# Patient Record
Sex: Female | Born: 1970
Health system: Southern US, Community
[De-identification: ages and names within clinical notes are randomized; demographics above are authoritative.]

## PROBLEM LIST (undated history)

## (undated) DIAGNOSIS — Z9889 Other specified postprocedural states: Secondary | ICD-10-CM

## (undated) DIAGNOSIS — R112 Nausea with vomiting, unspecified: Secondary | ICD-10-CM

## (undated) HISTORY — PX: OOPHORECTOMY: SHX86

## (undated) HISTORY — PX: ABDOMINAL HYSTERECTOMY: SHX81

---

## 1997-05-30 ENCOUNTER — Other Ambulatory Visit: Admission: RE | Admit: 1997-05-30 | Discharge: 1997-05-30 | Payer: Self-pay | Admitting: Obstetrics and Gynecology

## 1998-07-04 ENCOUNTER — Other Ambulatory Visit: Admission: RE | Admit: 1998-07-04 | Discharge: 1998-07-04 | Payer: Self-pay | Admitting: Obstetrics and Gynecology

## 1999-08-03 ENCOUNTER — Other Ambulatory Visit: Admission: RE | Admit: 1999-08-03 | Discharge: 1999-08-03 | Payer: Self-pay | Admitting: Obstetrics and Gynecology

## 1999-09-18 ENCOUNTER — Ambulatory Visit (HOSPITAL_COMMUNITY): Admission: RE | Admit: 1999-09-18 | Discharge: 1999-09-18 | Payer: Self-pay | Admitting: Obstetrics and Gynecology

## 2000-08-01 ENCOUNTER — Other Ambulatory Visit: Admission: RE | Admit: 2000-08-01 | Discharge: 2000-08-01 | Payer: Self-pay | Admitting: Obstetrics and Gynecology

## 2001-06-16 ENCOUNTER — Other Ambulatory Visit: Admission: RE | Admit: 2001-06-16 | Discharge: 2001-06-16 | Payer: Self-pay | Admitting: Obstetrics and Gynecology

## 2001-10-12 ENCOUNTER — Ambulatory Visit (HOSPITAL_COMMUNITY): Admission: RE | Admit: 2001-10-12 | Discharge: 2001-10-12 | Payer: Self-pay | Admitting: Obstetrics and Gynecology

## 2001-11-03 ENCOUNTER — Inpatient Hospital Stay (HOSPITAL_COMMUNITY): Admission: AD | Admit: 2001-11-03 | Discharge: 2001-11-06 | Payer: Self-pay | Admitting: *Deleted

## 2001-11-07 ENCOUNTER — Encounter: Admission: RE | Admit: 2001-11-07 | Discharge: 2001-12-07 | Payer: Self-pay | Admitting: Obstetrics and Gynecology

## 2001-12-06 ENCOUNTER — Other Ambulatory Visit: Admission: RE | Admit: 2001-12-06 | Discharge: 2001-12-06 | Payer: Self-pay | Admitting: Obstetrics and Gynecology

## 2002-12-26 ENCOUNTER — Other Ambulatory Visit: Admission: RE | Admit: 2002-12-26 | Discharge: 2002-12-26 | Payer: Self-pay | Admitting: Obstetrics and Gynecology

## 2004-01-21 ENCOUNTER — Other Ambulatory Visit: Admission: RE | Admit: 2004-01-21 | Discharge: 2004-01-21 | Payer: Self-pay | Admitting: Obstetrics and Gynecology

## 2004-02-28 ENCOUNTER — Observation Stay (HOSPITAL_COMMUNITY): Admission: RE | Admit: 2004-02-28 | Discharge: 2004-02-29 | Payer: Self-pay | Admitting: Obstetrics and Gynecology

## 2004-05-23 ENCOUNTER — Ambulatory Visit (HOSPITAL_COMMUNITY): Admission: RE | Admit: 2004-05-23 | Discharge: 2004-05-23 | Payer: Self-pay | Admitting: Obstetrics and Gynecology

## 2005-02-08 ENCOUNTER — Other Ambulatory Visit: Admission: RE | Admit: 2005-02-08 | Discharge: 2005-02-08 | Payer: Self-pay | Admitting: Obstetrics and Gynecology

## 2005-09-15 ENCOUNTER — Emergency Department (HOSPITAL_COMMUNITY): Admission: EM | Admit: 2005-09-15 | Discharge: 2005-09-16 | Payer: Self-pay | Admitting: Emergency Medicine

## 2005-09-16 ENCOUNTER — Observation Stay (HOSPITAL_COMMUNITY): Admission: RE | Admit: 2005-09-16 | Discharge: 2005-09-17 | Payer: Self-pay | Admitting: Family Medicine

## 2014-05-27 ENCOUNTER — Encounter: Payer: Self-pay | Admitting: Obstetrics and Gynecology

## 2014-05-27 ENCOUNTER — Ambulatory Visit (INDEPENDENT_AMBULATORY_CARE_PROVIDER_SITE_OTHER): Payer: BLUE CROSS/BLUE SHIELD | Admitting: Obstetrics and Gynecology

## 2014-05-27 VITALS — BP 110/66 | Ht 65.0 in | Wt 151.0 lb

## 2014-05-27 DIAGNOSIS — Z01419 Encounter for gynecological examination (general) (routine) without abnormal findings: Secondary | ICD-10-CM | POA: Diagnosis not present

## 2014-05-27 DIAGNOSIS — A630 Anogenital (venereal) warts: Secondary | ICD-10-CM

## 2014-05-27 DIAGNOSIS — Z0000 Encounter for general adult medical examination without abnormal findings: Secondary | ICD-10-CM

## 2014-05-27 NOTE — Progress Notes (Signed)
Patient ID: Natasha Anderson, female   DOB: March 09, 1970, 44 y.o.   MRN: [id number]  Assessment:  Annual Gyn Exam Condyloma of rt labia majora, for future excision. Plan:  1. pap smear done, next pap due 1 year 2. return annually or prn 3.   Annual mammogram advised, annually or biannually 4.   Discussed removal of condyloma in 1 month, recommend excision over TCA  Subjective:  Natasha Anderson is a 44 y.o. female, with a history of dysplasia, s/p abdominal hysterectomy and right oophorectomy. No obstetric history on file. who presents for annual exam. No LMP recorded. Patient has had a hysterectomy. The patient has no complaints today. She reports intermittent hot flashes.    The following portions of the patient's history were reviewed and updated as appropriate: allergies, current medications, past family history, past medical history, past social history, past surgical history and problem list. History reviewed. No pertinent past medical history.  Past Surgical History  Procedure Laterality Date  . Abdominal hysterectomy    . Oophorectomy       Current outpatient prescriptions:  .  hydrOXYzine (ATARAX/VISTARIL) 25 MG tablet, Take 25 mg by mouth at bedtime. Pt states that she only takes 1/2 a pill., Disp: , Rfl:  .  Multiple Vitamin (MULTIVITAMIN) tablet, Take 1 tablet by mouth daily., Disp: , Rfl:   Review of Systems Constitutional: negative Gastrointestinal: negative Genitourinary: negative  Objective:  BP 110/66 mmHg  Ht 5\' 5"  (1.651 m)  Wt 151 lb (68.493 kg)  BMI 25.13 kg/m2   BMI: Body mass index is 25.13 kg/(m^2).  General Appearance: Alert, appropriate appearance for age. No acute distress HEENT: Grossly normal Neck / Thyroid:  Cardiovascular: RRR; normal S1, S2, no murmur Lungs: CTA bilaterally Back: No CVAT Breast Exam: No masses or nodes.No dimpling, nipple retraction or discharge. Gastrointestinal: Soft, non-tender, no masses or organomegaly Pelvic Exam: Vulva  and vagina appear normal.  External genitalia: normal general appearance; 1 cm flat condyloma inner aspects of right labia majora Vaginal: normal mucosa without prolapse or lesions; well-estrogenized tissue Cervix: surgically absent Adnexa: surgically absent Uterus: surgically absent Rectovaginal: well-supported; guaiac negative stool obtained Lymphatic Exam: Non-palpable nodes in neck, clavicular, axillary, or inguinal regions  Skin: no rash or abnormalities Neurologic: Normal gait and speech, no tremor  Psychiatric: Alert and oriented, appropriate affect.  Urinalysis:Not done Hemoccult: Negative  Christin BachJohn Royetta Probus. MD Pgr [phone] 2:33 PM   This chart was scribed for Tilda BurrowJohn Owyn Raulston V, MD by Gwenyth Oberatherine Macek, ED Scribe. This patient was seen in room 1 and the patient's care was started at 2:33 PM.   I personally performed the services described in this documentation, which was SCRIBED in my presence. The recorded information has been reviewed and considered accurate. It has been edited as necessary during review. Tilda BurrowFERGUSON,Kriti Katayama V, MD

## 2014-05-27 NOTE — Progress Notes (Signed)
Patient ID: Natasha Anderson, female   DOB: [birth year], 44 y.o.   MRN: [id number] Pt here today for annual exam. Pt states that she was unsure when she should start getting mammograms. Pt denies any problems or concerns at this time.

## 2014-06-06 ENCOUNTER — Other Ambulatory Visit: Payer: Self-pay | Admitting: Obstetrics and Gynecology

## 2014-06-06 DIAGNOSIS — Z1231 Encounter for screening mammogram for malignant neoplasm of breast: Secondary | ICD-10-CM

## 2014-06-28 ENCOUNTER — Ambulatory Visit: Payer: BLUE CROSS/BLUE SHIELD | Admitting: Obstetrics and Gynecology

## 2014-07-11 ENCOUNTER — Ambulatory Visit (HOSPITAL_COMMUNITY)
Admission: RE | Admit: 2014-07-11 | Discharge: 2014-07-11 | Disposition: A | Discharge status: Home / Self Care | Payer: BLUE CROSS/BLUE SHIELD | Source: Ambulatory Visit | Attending: Obstetrics and Gynecology | Admitting: Obstetrics and Gynecology

## 2014-07-11 DIAGNOSIS — Z1231 Encounter for screening mammogram for malignant neoplasm of breast: Secondary | ICD-10-CM

## 2016-03-19 ENCOUNTER — Other Ambulatory Visit: Payer: Self-pay | Admitting: Obstetrics and Gynecology

## 2016-03-19 DIAGNOSIS — Z1231 Encounter for screening mammogram for malignant neoplasm of breast: Secondary | ICD-10-CM

## 2016-04-02 ENCOUNTER — Ambulatory Visit (INDEPENDENT_AMBULATORY_CARE_PROVIDER_SITE_OTHER): Payer: BLUE CROSS/BLUE SHIELD | Admitting: Obstetrics and Gynecology

## 2016-04-02 ENCOUNTER — Encounter: Payer: Self-pay | Admitting: Obstetrics and Gynecology

## 2016-04-02 DIAGNOSIS — A630 Anogenital (venereal) warts: Secondary | ICD-10-CM | POA: Insufficient documentation

## 2016-04-02 DIAGNOSIS — Z01419 Encounter for gynecological examination (general) (routine) without abnormal findings: Secondary | ICD-10-CM | POA: Insufficient documentation

## 2016-04-02 NOTE — Progress Notes (Signed)
  Assessment:  Annual Gyn Exam   Plan:  1. pap smear done, next pap due 3y 2. return annually or prn 3    Annual mammogram advised Subjective:  Natasha Anderson is a 46 y.o. female, who presents for annual exam. No LMP recorded. Patient has had a hysterectomy; no longer has menstrual periods. The patient has no acute complaints today but mentions continued hot flashes. She uses OTC estrogen meds to help manage her hot flashes. Her last pap smear was in 2016.  She also notes she has a mammogram scheduled for April 1st, 2018.   The following portions of the patient's history were reviewed and updated as appropriate: allergies, current medications, past family history, past medical history, past social history, past surgical history and problem list.  History reviewed. No pertinent past medical history.  Past Surgical History:  Procedure Laterality Date  . ABDOMINAL HYSTERECTOMY    . OOPHORECTOMY       Current Outpatient Prescriptions:  .  Black Cohosh-SoyIsoflav-Magnol (ESTROVEN MENOPAUSE RELIEF PO), Take by mouth., Disp: , Rfl:  .  hydrOXYzine (ATARAX/VISTARIL) 25 MG tablet, Take 25 mg by mouth at bedtime. Pt states that she only takes 1/2 a pill., Disp: , Rfl:  .  Multiple Vitamin (MULTIVITAMIN) tablet, Take 1 tablet by mouth daily., Disp: , Rfl:   Review of Systems Otherwise negative for acute change except as noted in the HPI.  Objective:  BP 114/80   Pulse 72   Ht 5\' 4"  (1.626 m)   Wt 143 lb (64.9 kg)   BMI 24.55 kg/m    BMI: Body mass index is 24.55 kg/m.  General Appearance: Alert, appropriate appearance for age. No acute distress HEENT: Grossly normal Neck / Thyroid:  Cardiovascular: RRR; normal S1, S2, no murmur Lungs: CTA bilaterally Back: No CVAT Breast Exam: No dimpling, nipple retraction or discharge. No masses or nodes. and No masses or nodes.No dimpling, nipple retraction or discharge. Gastrointestinal: Soft, non-tender, no masses or organomegaly Pelvic  Exam:  External genitalia: vulvar condyloma right labia, stable  Vaginal:  top of vaginal is well supported Cervix: cuff well healed  Adnexa: good support, mild thickening on the right  Uterus: absent  Pap smear done today Rectovaginal: normal rectal, no masses Lymphatic Exam: Non-palpable nodes in neck, clavicular, axillary, or inguinal regions Skin: no rash or abnormalities Neurologic: Normal gait and speech, no tremor  Psychiatric: Alert and oriented, appropriate affect.  Urinalysis:Not done   By signing my name below, I, Freida Busmaniana Omoyeni, attest that this documentation has been prepared under the direction and in the presence of Tilda BurrowJohn V Anelia Carriveau, MD . Electronically Signed: Freida Busmaniana Omoyeni, Scribe. 04/02/2016. 10:14 AM.  I personally performed the services described in this documentation, which was SCRIBED in my presence. The recorded information has been reviewed and considered accurate. It has been edited as necessary during review. Tilda BurrowFERGUSON,Micah Barnier V, MD

## 2016-04-06 ENCOUNTER — Other Ambulatory Visit: Payer: Self-pay | Admitting: Obstetrics and Gynecology

## 2016-04-06 ENCOUNTER — Telehealth: Payer: Self-pay | Admitting: Obstetrics and Gynecology

## 2016-04-06 DIAGNOSIS — N951 Menopausal and female climacteric states: Secondary | ICD-10-CM | POA: Insufficient documentation

## 2016-04-06 MED ORDER — ESTRADIOL 0.52 MG/0.87 GM (0.06%) TD GEL: 1.0000 "application " | Freq: Every evening | TRANSDERMAL | 99 refills | Status: DC

## 2016-04-06 NOTE — Telephone Encounter (Signed)
elestrin topical estrogen ordered.

## 2016-04-06 NOTE — Progress Notes (Signed)
Elestrin  Ordered.

## 2016-04-06 NOTE — Telephone Encounter (Signed)
Pt called stating that she seen Dr. Emelda FearFerguson on the 16th and he was suppose to call her in a medication for her hot flashes, pt states that he never did. Pt uses [facility]. let pt know that Dr. Althea GrimmerFerguosn is not here today. Please contact pt

## 2016-04-22 ENCOUNTER — Other Ambulatory Visit: Payer: Self-pay | Admitting: Obstetrics and Gynecology

## 2016-04-22 ENCOUNTER — Ambulatory Visit (HOSPITAL_COMMUNITY)
Admission: RE | Admit: 2016-04-22 | Discharge: 2016-04-22 | Disposition: A | Discharge status: Home / Self Care | Payer: BLUE CROSS/BLUE SHIELD | Source: Ambulatory Visit | Attending: Obstetrics and Gynecology | Admitting: Obstetrics and Gynecology

## 2016-04-22 DIAGNOSIS — R928 Other abnormal and inconclusive findings on diagnostic imaging of breast: Secondary | ICD-10-CM | POA: Diagnosis not present

## 2016-04-22 DIAGNOSIS — Z1231 Encounter for screening mammogram for malignant neoplasm of breast: Secondary | ICD-10-CM | POA: Diagnosis not present

## 2016-05-18 ENCOUNTER — Ambulatory Visit (HOSPITAL_COMMUNITY)
Admission: RE | Admit: 2016-05-18 | Discharge: 2016-05-18 | Disposition: A | Discharge status: Home / Self Care | Payer: BLUE CROSS/BLUE SHIELD | Source: Ambulatory Visit | Attending: Obstetrics and Gynecology | Admitting: Obstetrics and Gynecology

## 2016-05-18 DIAGNOSIS — R928 Other abnormal and inconclusive findings on diagnostic imaging of breast: Secondary | ICD-10-CM | POA: Insufficient documentation

## 2016-05-18 DIAGNOSIS — R922 Inconclusive mammogram: Secondary | ICD-10-CM | POA: Diagnosis not present

## 2016-11-09 DIAGNOSIS — K080 Exfoliation of teeth due to systemic causes: Secondary | ICD-10-CM | POA: Diagnosis not present

## 2017-04-26 ENCOUNTER — Other Ambulatory Visit: Payer: Self-pay | Admitting: Obstetrics and Gynecology

## 2017-06-02 ENCOUNTER — Other Ambulatory Visit: Payer: Self-pay | Admitting: Obstetrics and Gynecology

## 2017-08-06 ENCOUNTER — Other Ambulatory Visit: Payer: Self-pay | Admitting: Obstetrics and Gynecology

## 2017-08-06 NOTE — Telephone Encounter (Signed)
estrogel 50 gm tube with 3 refil E-scribed

## 2017-08-11 ENCOUNTER — Ambulatory Visit (INDEPENDENT_AMBULATORY_CARE_PROVIDER_SITE_OTHER): Payer: BLUE CROSS/BLUE SHIELD | Admitting: Obstetrics and Gynecology

## 2017-08-11 ENCOUNTER — Encounter: Payer: Self-pay | Admitting: Obstetrics and Gynecology

## 2017-08-11 VITALS — BP 100/55 | HR 92 | Ht 66.0 in | Wt 144.0 lb

## 2017-08-11 DIAGNOSIS — Z01419 Encounter for gynecological examination (general) (routine) without abnormal findings: Secondary | ICD-10-CM | POA: Diagnosis not present

## 2017-08-11 MED ORDER — ESTRADIOL 0.75 MG/1.25 GM (0.06%) TD GEL: TRANSDERMAL | 3 refills | Status: DC

## 2017-08-11 NOTE — Progress Notes (Signed)
Patient ID: Natasha Anderson, female   DOB: [birth year], 47 y.o.   MRN: [id number]   Assessment:  Annual Gyn Exam Plan:  1. next pap due 3 yrs 2. return annually or prn 3    Annual mammogram advised after age 48 Subjective:  Natasha Anderson is a 47 y.o. female G3P3 who presents for annual exam. No LMP recorded. Patient has had a hysterectomy. The patient has no complaints today but is still struggling with hot flashes sometimes. Transdermal estrogen works for this. She has a large scratch on her leg from being scratched by a branch while walking her dog. She used to run 2-3 miles per day but suffered from a leg injury so she walks now. 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 She reports no issues with her bowel movements. She has a history of endometriosis. She was last here for her annual exam on 04/02/2016 and her last mammogram was on 04/18/2016 and she is planning on scheduling another one. She does not currently have a PCP.  The following portions of the patient's history were reviewed and updated as appropriate: allergies, current medications, past family history, past medical history, past social history, past surgical history and problem list. History reviewed. No pertinent past medical history.  Past Surgical History:  Procedure Laterality Date  . ABDOMINAL HYSTERECTOMY    . OOPHORECTOMY       Current Outpatient Medications:  .  Black Cohosh-SoyIsoflav-Magnol (ESTROVEN MENOPAUSE RELIEF PO), Take by mouth., Disp: , Rfl:  .  ESTROGEL 0.75 MG/1.25 GM (0.06%) topical gel, APPLY TO THE AFFECTED AREA ONCE NIGHTLY, Disp: 50 g, Rfl: 3 .  Multiple Vitamin (MULTIVITAMIN) tablet, Take 1 tablet by mouth daily., Disp: , Rfl:  .  OVER THE COUNTER MEDICATION, , Disp: , Rfl:  .  Estradiol 0.52 MG/0.87 GM (0.06%) GEL, Apply 1 application topically Nightly. (Patient not taking: Reported on 08/11/2017), Disp: 1 Bottle, Rfl: prn .  hydrOXYzine (ATARAX/VISTARIL) 25 MG tablet, Take 25 mg by mouth at bedtime.  Pt states that she only takes 1/2 a pill., Disp: , Rfl:   Review of Systems Constitutional: negative Gastrointestinal: negative Genitourinary: negative  Objective:  BP (!) 100/55 (BP Location: Right Arm, Patient Position: Sitting, Cuff Size: Small)   Pulse 92   Ht 5\' 6"  (1.676 m)   Wt 144 lb (65.3 kg)   BMI 23.24 kg/m    BMI: Body mass index is 23.24 kg/m.  General Appearance: Alert, appropriate appearance for age. No acute distress HEENT: Grossly normal Neck / Thyroid:  Cardiovascular: RRR; normal S1, S2, no murmur Lungs: CTA bilaterally Back: No CVAT Breast Exam: No masses or nodes.No dimpling, nipple retraction or discharge. Gastrointestinal: Soft, non-tender, no masses or organomegaly Pelvic Exam: External genitalia: normal general appearance Vaginal: well supported Cervix: normal Adnexa: normal Uterus: surgically removed Rectovaginal: not indicated and guaiac negative stool obtained Lymphatic Exam: Non-palpable nodes in neck, clavicular, axillary, or inguinal regions Skin: no rash or abnormalities Neurologic: Normal gait and speech, no tremor  Psychiatric: Alert and oriented, appropriate affect.  Urinalysis:normal and Not done  Christin BachJohn Shamieka Gullo. MD Pgr [phone] 9:34 AM   By signing my name below, I, Pietro CassisEmily Tufford, attest that this documentation has been prepared under the direction and in the presence of Tilda BurrowFerguson, Kindell Strada V, MD. Electronically Signed: Pietro CassisEmily Tufford, Medical Scribe. 08/11/17. 9:34 AM.  I personally performed the services described in this documentation, which was SCRIBED in my presence. The recorded information has been reviewed and considered accurate. It  has been edited as necessary during review. Jonnie Kind, MD

## 2017-08-11 NOTE — Patient Instructions (Signed)
[facility] [phone]

## 2017-08-19 DIAGNOSIS — Z01419 Encounter for gynecological examination (general) (routine) without abnormal findings: Secondary | ICD-10-CM | POA: Diagnosis not present

## 2017-08-20 LAB — CBC
Hematocrit: 47.3 % — ABNORMAL HIGH (ref 34.0–46.6)
Hemoglobin: 15.4 g/dL (ref 11.1–15.9)
MCH: 29.9 pg (ref 26.6–33.0)
MCHC: 32.6 g/dL (ref 31.5–35.7)
MCV: 92 fL (ref 79–97)
Platelets: 249 10*3/uL (ref 150–450)
RBC: 5.15 x10E6/uL (ref 3.77–5.28)
RDW: 12.1 % — ABNORMAL LOW (ref 12.3–15.4)
WBC: 6.5 10*3/uL (ref 3.4–10.8)

## 2017-08-20 LAB — COMPREHENSIVE METABOLIC PANEL
ALT: 15 IU/L (ref 0–32)
AST: 23 IU/L (ref 0–40)
Albumin/Globulin Ratio: 2.4 — ABNORMAL HIGH (ref 1.2–2.2)
Albumin: 4.1 g/dL (ref 3.5–5.5)
Alkaline Phosphatase: 30 IU/L — ABNORMAL LOW (ref 39–117)
BUN/Creatinine Ratio: 11 (ref 9–23)
BUN: 7 mg/dL (ref 6–24)
Bilirubin Total: 0.3 mg/dL (ref 0.0–1.2)
CO2: 25 mmol/L (ref 20–29)
Calcium: 9 mg/dL (ref 8.7–10.2)
Chloride: 104 mmol/L (ref 96–106)
Creatinine, Ser: 0.64 mg/dL (ref 0.57–1.00)
GFR calc Af Amer: 123 mL/min/{1.73_m2} (ref >59)
GFR calc non Af Amer: 107 mL/min/{1.73_m2} (ref >59)
Globulin, Total: 1.7 g/dL (ref 1.5–4.5)
Glucose: 97 mg/dL (ref 65–99)
Potassium: 4.5 mmol/L (ref 3.5–5.2)
Sodium: 144 mmol/L (ref 134–144)
Total Protein: 5.8 g/dL — ABNORMAL LOW (ref 6.0–8.5)

## 2017-08-20 LAB — TSH: TSH: 0.667 u[IU]/mL (ref 0.450–4.500)

## 2017-08-20 LAB — LIPID PANEL
Chol/HDL Ratio: 2.6 ratio (ref 0.0–4.4)
Cholesterol, Total: 189 mg/dL (ref 100–199)
HDL: 74 mg/dL (ref >39)
LDL Calculated: 103 mg/dL — ABNORMAL HIGH (ref 0–99)
Triglycerides: 59 mg/dL (ref 0–149)
VLDL Cholesterol Cal: 12 mg/dL (ref 5–40)

## 2018-02-22 ENCOUNTER — Other Ambulatory Visit: Payer: Self-pay | Admitting: Obstetrics and Gynecology

## 2018-07-21 ENCOUNTER — Other Ambulatory Visit: Payer: Self-pay | Admitting: Obstetrics and Gynecology

## 2018-07-21 MED ORDER — ESTROGEL 0.75 MG/1.25 GM (0.06%) TD GEL: TRANSDERMAL | 3 refills | Status: DC

## 2018-07-21 NOTE — Progress Notes (Signed)
estrogel reordered

## 2018-07-27 ENCOUNTER — Telehealth: Payer: Self-pay | Admitting: *Deleted

## 2018-07-27 NOTE — Telephone Encounter (Signed)
Patient called stating the pharmacy said they cannot get a response back from our office. Please advise

## 2018-08-11 ENCOUNTER — Ambulatory Visit (INDEPENDENT_AMBULATORY_CARE_PROVIDER_SITE_OTHER): Payer: BC Managed Care – PPO | Admitting: Orthopedic Surgery

## 2018-08-11 ENCOUNTER — Encounter: Payer: Self-pay | Admitting: Orthopedic Surgery

## 2018-08-11 ENCOUNTER — Other Ambulatory Visit: Payer: Self-pay

## 2018-08-11 ENCOUNTER — Ambulatory Visit: Payer: BC Managed Care – PPO

## 2018-08-11 VITALS — BP 114/62 | HR 68 | Temp 97.5°F | Ht 66.0 in | Wt 145.0 lb

## 2018-08-11 DIAGNOSIS — S83241A Other tear of medial meniscus, current injury, right knee, initial encounter: Secondary | ICD-10-CM | POA: Diagnosis not present

## 2018-08-11 DIAGNOSIS — M25562 Pain in left knee: Secondary | ICD-10-CM

## 2018-08-11 NOTE — Patient Instructions (Signed)
Acute Knee Pain, Adult °Acute knee pain is sudden and may be caused by damage, swelling, or irritation of the muscles and tissues that support your knee. The injury may result from: °· A fall. °· An injury to your knee from twisting motions. °· A hit to the knee. °· Infection. °Acute knee pain may go away on its own with time and rest. If it does not, your health care provider may order tests to find the cause of the pain. These may include: °· Imaging tests, such as an X-ray, MRI, or ultrasound. °· Joint aspiration. In this test, fluid is removed from the knee. °· Arthroscopy. In this test, a lighted tube is inserted into the knee and an image is projected onto a TV screen. °· Biopsy. In this test, a sample of tissue is removed from the body and studied under a microscope. °Follow these instructions at home: °Pay attention to any changes in your symptoms. Take these actions to relieve your pain. °If you have a knee sleeve or brace: ° °· Wear the sleeve or brace as told by your health care provider. Remove it only as told by your health care provider. °· Loosen the sleeve or brace if your toes tingle, become numb, or turn cold and blue. °· Keep the sleeve or brace clean. °· If the sleeve or brace is not waterproof: °? Do not let it get wet. °? Cover it with a watertight covering when you take a bath or shower. °Activity °· Rest your knee. °· Do not do things that cause pain or make pain worse. °· Avoid high-impact activities or exercises, such as running, jumping rope, or doing jumping jacks. °· Work with a physical therapist to make a safe exercise program, as recommended by your health care provider. Do exercises as told by your physical therapist. °Managing pain, stiffness, and swelling ° °· If directed, put ice on the knee: °? Put ice in a plastic bag. °? Place a towel between your skin and the bag. °? Leave the ice on for 20 minutes, 2-3 times a day. °· If directed, use an elastic bandage to put pressure  (compression) on your injured knee. This may control swelling, give support, and help with discomfort. °General instructions °· Take over-the-counter and prescription medicines only as told by your health care provider. °· Raise (elevate) your knee above the level of your heart when you are sitting or lying down. °· Sleep with a pillow under your knee. °· Do not use any products that contain nicotine or tobacco, such as cigarettes, e-cigarettes, and chewing tobacco. These can delay healing. If you need help quitting, ask your health care provider. °· If you are overweight, work with your health care provider and a dietitian to set a weight-loss goal that is healthy and reasonable for you. Extra weight can put pressure on your knee. °· Keep all follow-up visits as told by your health care provider. This is important. °Contact a health care provider if: °· Your knee pain continues, changes, or gets worse. °· You have a fever along with knee pain. °· Your knee feels warm to the touch. °· Your knee buckles or locks up. °Get help right away if: °· Your knee swells, and the swelling becomes worse. °· You cannot move your knee. °· You have severe pain in your knee. °Summary °· Acute knee pain can be caused by a fall, an injury, an infection, or damage, swelling, or irritation of the tissues that support your knee. °·   Your health care provider may perform tests to find out the cause of the pain. °· Pay attention to any changes in your symptoms. Relieve your pain with rest, medicines, light activity, and use of ice. °· Get help if your pain continues or becomes worse, your knee swells, or you cannot move your knee. °This information is not intended to replace advice given to you by your health care provider. Make sure you discuss any questions you have with your health care provider. °Document Released: 11/01/2006 Document Revised: 06/16/2017 Document Reviewed: 06/16/2017 °Elsevier Patient Education © 2020 [employer] ° °

## 2018-08-11 NOTE — Addendum Note (Signed)
Addended byCandice Camp on: 08/11/2018 08:59 AM   Modules accepted: Orders

## 2018-08-11 NOTE — Progress Notes (Signed)
Natasha Anderson  08/11/2018  HISTORY SECTION :  Chief Complaint  Patient presents with  . Knee Pain    right knee pain x 3-4 weeks    The patient presents for evaluation of right knee medial pain x1 month.  She is a 48 year old female runs 2 miles a day when healthy presents with 1 month history of medial knee pain which may have been brought on by new exercise program.  Denies giving way but reports swelling every day unrelieved by ibuprofen.  Pain is not improving with ibuprofen ice rest change in activity and knee sleeve     Review of Systems  Neurological: Negative for tingling.  All other systems reviewed and are negative.    History reviewed. No pertinent past medical history.  No diabetes heart disease or hypertension  Past Surgical History:  Procedure Laterality Date  . ABDOMINAL HYSTERECTOMY    . OOPHORECTOMY       No Known Allergies   Current Outpatient Medications:  .  Estradiol (ESTROGEL) 0.75 MG/1.25 GM (0.06%) topical gel, APPLY TO THE AFFECTED AREA ONCE NIGHTLY, Disp: 50 g, Rfl: 3 .  Multiple Vitamin (MULTIVITAMIN) tablet, Take 1 tablet by mouth daily., Disp: , Rfl:    PHYSICAL EXAM SECTION: 1) BP 114/62   Pulse 68   Temp (!) 97.5 F (36.4 C)   Ht 5\' 6"  (1.676 m)   Wt 145 lb (65.8 kg)   BMI 23.40 kg/m   Body mass index is 23.4 kg/m. General appearance: Well-developed well-nourished no gross deformities  2) Cardiovascular normal pulse and perfusion in all 4 extremities normal color without edema  3) Neurologically deep tendon reflexes are equal and normal, no sensation loss or deficits no pathologic reflexes  4) Psychological: Awake alert and oriented x3 mood and affect normal  5) Skin no lacerations or ulcerations no nodularity no palpable masses, no erythema or nodularity  6) Musculoskeletal:  Left knee no tenderness normal range of motion all ligaments stable muscle tone normal skin intact with no rashes lesions or ulcerations no  erythema  Right knee alignment is normal she has tenderness on the medial joint line she has a small knee effusion she has full range of motion with pain at terminal flexion.  No ligamentous instability normal strength and muscle tone normal patellar mobility without apprehension  Positive McMurray sign for medial pain   MEDICAL DECISION SECTION:  Encounter Diagnoses  Name Primary?  . Acute pain of left knee   . Acute tear medial meniscus, right, initial encounter Yes    Imaging Joint effusion slight valgus, see report  Plan:  (Rx., Inj., surg., Frx, MRI/CT, XR:2) Home exercises quad strengthening Ibuprofen Ice Activity modification  MRI medial meniscal tear rule out 8:57 AM Arther Abbott, MD  08/11/2018

## 2018-09-02 ENCOUNTER — Other Ambulatory Visit: Payer: Self-pay

## 2018-09-02 ENCOUNTER — Ambulatory Visit
Admission: RE | Admit: 2018-09-02 | Discharge: 2018-09-02 | Disposition: A | Discharge status: Home / Self Care | Payer: BC Managed Care – PPO | Source: Ambulatory Visit | Attending: Orthopedic Surgery | Admitting: Orthopedic Surgery

## 2018-09-02 DIAGNOSIS — M25562 Pain in left knee: Secondary | ICD-10-CM

## 2018-09-02 DIAGNOSIS — M23341 Other meniscus derangements, anterior horn of lateral meniscus, right knee: Secondary | ICD-10-CM | POA: Diagnosis not present

## 2018-09-12 ENCOUNTER — Telehealth: Payer: Self-pay | Admitting: Orthopedic Surgery

## 2018-09-12 ENCOUNTER — Other Ambulatory Visit: Payer: Self-pay | Admitting: Orthopedic Surgery

## 2018-09-12 ENCOUNTER — Telehealth: Payer: Self-pay | Admitting: Radiology

## 2018-09-12 NOTE — Telephone Encounter (Signed)
-----   Message from Carole Civil, MD sent at 09/12/2018  1:00 PM EDT ----- [facility] OCT 1

## 2018-09-12 NOTE — Telephone Encounter (Signed)
IMPRESSION: 1. Complex tear of the lateral meniscus anterior horn with adjacent 11 mm parameniscal cyst. Horizontal component extends to the anterior horn/body junction where there is a tiny intrameniscal cyst. 2. Sprain of the proximal MCL. 3. Mild medial compartment osteoarthritis. Partial-thickness cartilage loss over the patella. 4. Moderate joint effusion. 5. Focal abnormal edema in the superolateral aspect of Hoffa's fat pad, which can be seen in the setting of patellar maltracking.   Electronically Signed   By: Titus Dubin M.D.   On: 09/03/2018 10:07

## 2018-09-12 NOTE — Telephone Encounter (Signed)
called discussed   She wants October 1ST OF THE MONTH [facility]  IMPRESSION: 1. Complex tear of the lateral meniscus anterior horn with adjacent 11 mm parameniscal cyst. Horizontal component extends to the anterior horn/body junction where there is a tiny intrameniscal cyst. 2. Sprain of the proximal MCL. 3. Mild medial compartment osteoarthritis. Partial-thickness cartilage loss over the patella. 4. Moderate joint effusion. 5. Focal abnormal edema in the superolateral aspect of Hoffa's fat pad, which can be seen in the setting of patellar maltracking.     Electronically Signed   By: Titus Dubin M.D.   On: 09/03/2018 10:07

## 2018-09-12 NOTE — Telephone Encounter (Signed)
SET UP [facility] LM OCT 1

## 2018-09-12 NOTE — Telephone Encounter (Signed)
Discussed further with patient, explained process, put in orders.

## 2018-10-10 NOTE — Patient Instructions (Signed)
Your procedure is scheduled on: 10/19/2018  Report to [facility] at   6:15  AM.  Call this number if you have problems the morning of surgery: [phone]   Remember:   Do not Eat or Drink after midnight   :  Take these medicines the morning of surgery with A SIP OF WATER: none   Do not wear jewelry, make-up or nail polish.  Do not wear lotions, powders, or perfumes. You may wear deodorant.  Do not shave 48 hours prior to surgery. Men may shave face and neck.  Do not bring valuables to the hospital.  Contacts, dentures or bridgework may not be worn into surgery.  Leave suitcase in the car. After surgery it may be brought to your room.  For patients admitted to the hospital, checkout time is 11:00 AM the day of discharge.   Patients discharged the day of surgery will not be allowed to drive home.    Special Instructions: Shower using CHG night before surgery and shower the day of surgery use CHG.  Use special wash - you have one bottle of CHG for all showers.  You should use approximately 1/2 of the bottle for each shower.  Knee Arthroscopy Knee arthroscopy is a surgery to examine the inside of the knee joint and repair any damage to cartilage, surfaces, and other soft tissues around the joint. You may have this surgery if non-surgical treatment has not relieved your symptoms. Knee arthroscopy may be used to:  Repair a torn ligament or other torn tissues. Ligaments are tissues that connect bones to each other.  Remove bone fragments.  Remove a fluid-filled sac (cyst).  Treat kneecap (patella)problems.  Treat an advanced infection in the knee (septicknee). Arthroscopic surgery is done using a thin tube that has a light and camera on the end of it (arthroscope). The arthroscope is placed through a small incision, and the camera sends images to a screen in the operating room. The images are used to help perform the surgery. Tell a health care provider about:  Any allergies you  have.  All medicines you are taking, including vitamins, herbs, eye drops, creams, and over-the-counter medicines.  Any problems you or family members have had with anesthetic medicines.  Any blood disorders you have.  Any surgeries you have had.  Any medical conditions you have.  Whether you are pregnant or may be pregnant. What are the risks? Generally, this is a safe procedure. However, problems may occur, including:  Infection.  Bleeding.  Allergic reactions to medicines.  Damage to blood vessels, nerves, or tissues in the knee.  A blood clot that forms in the leg and travels to the lung (pulmonary embolism).  Failure of the surgery to relieve symptoms.  Knee stiffness. What happens before the procedure? Staying hydrated Follow instructions from your health care provider about hydration, which may include:  Up to 2 hours before the procedure - you may continue to drink clear liquids, such as water, clear fruit juice, black coffee, and plain tea. Eating and drinking restrictions Follow instructions from your health care provider about eating and drinking, which may include:  8 hours before the procedure - stop eating heavy meals or foods such as meat, fried foods, or fatty foods.  6 hours before the procedure - stop eating light meals or foods, such as toast or cereal.  6 hours before the procedure - stop drinking milk or drinks that contain milk.  2 hours before the procedure - stop  drinking clear liquids. Medicines Ask your health care provider about:  Changing or stopping your regular medicines. This is especially important if you are taking diabetes medicines or blood thinners.  Taking medicines such as aspirin and ibuprofen. These medicines can thin your blood. Do not take these medicines unless your health care provider tells you to take them.  Taking over-the-counter medicines, vitamins, herbs, and supplements. Testing  Your knee may be examined. Your  health care provider may move your knee or ask you to move it in specific ways to see how much motion you have.  You may have blood tests.  You may have imaging tests, such as an X-ray, MRI, or CT scan.  You may have an electrocardiogram. This test records the electrical activity in the heart. General instructions  Do not drink alcohol unless your health care provider says that you can.  Do not use any products that contain nicotine or tobacco, such as cigarettes and e-cigarettes, for one month or more before your surgery. If you need help quitting, ask your health care provider.  Plan to have someone take you home from the hospital or clinic.  Plan to have a responsible adult care for you for at least 24 hours after you leave the hospital or clinic. This is important. What happens during the procedure?   To lower your risk of infection: ? Your health care team will wash or sanitize their hands. ? Hair may be removed from the surgical area. ? Your skin will be washed with soap.  An IV will be inserted into one of your veins.  You will be given one or more of the following: ? A medicine to help you relax (sedative). ? A medicine to numb the knee area (local anesthetic). ? A medicine to make you fall asleep (general anesthetic). ? A medicine that is injected into an area of your body to numb everything below the injection site (regional anesthetic). This may be injected into your groin or thigh.  A cuff may be placed around your upper leg to slow blood flow to your lower leg during the procedure.  Several small incisions will be made around your knee.  Your knee joint will be rinsed (flushed) and filled with a germ-free solution (sterile saline). This expands the area to allow your surgeon to see the joint more clearly.  An arthroscope will be passed through one of your incisions, into your knee joint.  Other surgical instruments will be passed through the other incisions. Then,  your surgeon will examine and repair your knee as needed.  The sterile saline will be drained from your knee, and the cuff will be removed from your upper leg.  Your incisions will be closed with adhesive strips or stitches (sutures) and covered with a bandage (dressing). The procedure may vary among health care providers and hospitals. What happens after the procedure?  Your blood pressure, heart rate, breathing rate, and blood oxygen level will be monitored until the medicines you were given have worn off.  You will be given pain medicine as needed.  You may be given medicine to lower your risk of blood clots.  You may have to wear compression stockings. These stockings help to prevent blood clots and reduce swelling in your legs.  Your health care provider will give you instructions about how much body weight you can safely support on your leg (weight-bearing restrictions). You may be given crutches or other devices to help you move around (assistive devices).  You may be shown how to do physical therapy exercises to help you recover.  Do not drive until your health care provider approves. Summary  Knee arthroscopy is a surgery to examine or repair the inside of the knee joint.  Before the procedure, follow instructions from your health care provider about eating and drinking.  Plan to have someone take you home from the hospital or clinic. This information is not intended to replace advice given to you by your health care provider. Make sure you discuss any questions you have with your health care provider. Document Released: 01/02/2000 Document Revised: 12/17/2016 Document Reviewed: 10/07/2016 Elsevier Patient Education  2020 [employer]  Knee Arthroscopy, Care After This sheet gives you information about how to care for yourself after your procedure. Your health care provider may also give you more specific instructions. If you have problems or questions, contact your health  care provider. What can I expect after the procedure? After the procedure, it is common to have:  Soreness.  Swelling.  Pain that can be relieved by taking pain medicine. Follow these instructions at home: Incision care   Follow instructions from your health care provider about how to take care of your incisions. Make sure you: ? Wash your hands with soap and water before you change your bandage (dressing). If soap and water are not available, use hand sanitizer. ? Change your dressing as told by your health care provider. ? Leave stitches (sutures), staples, skin glue, or adhesive strips in place. These skin closures may need to stay in place for 2 weeks or longer. If adhesive strip edges start to loosen and curl up, you may trim the loose edges. Do not remove adhesive strips completely unless your health care provider tells you to do that.  Check your incision areas every day for signs of infection. Check for: ? Redness. ? More swelling or pain. ? Fluid or blood. ? Warmth. ? Pus or a bad smell. Bathing  Do not take baths, swim, or use a hot tub until your health care provider approves. Ask your health care provider if you may take showers. You may only be allowed to take sponge baths. Activity  Do not use your knee to support your body weight until your health care provider says that you can. Follow weight-bearing restrictions as told. Use crutches or other devices to help you move around (assistive devices) as directed.  Ask your health care provider what activities are safe for you during recovery, and what activities you need to avoid.  If physical therapy was prescribed, do exercises as directed. Doing exercises may help improve knee movement and flexibility (range of motion).  Do not lift anything that is heavier than 10 lb (4.5 kg), or the limit that you are told, until your health care provider says that it is safe. Driving  Do not drive until your health care provider  approves. You may be able to drive after 1-3 weeks.  Do not drive or use heavy machinery while taking prescription pain medicine. Managing pain, stiffness, and swelling   If directed, put ice on the injured area: ? Put ice in a plastic bag or use the icing device (cold therapy unit) that you were given. Follow instructions from your health care provider about how to use the icing device. ? Place a towel between your skin and the bag or between your skin and the icing device. ? Leave the ice on for 20 minutes, 2-3 times a day.  Move your toes often to avoid stiffness and to lessen swelling.  Raise (elevate) the injured area above the level of your heart while you are sitting or lying down. If you are taking blood thinners:  Before you take any medicines that contain aspirin or NSAIDs, talk with your health care provider. These medicines increase your risk for dangerous bleeding.  Take your medicine exactly as told, at the same time every day.  Avoid activities that could cause injury or bruising, and follow instructions about how to prevent falls.  Wear a medical alert bracelet or carry a card that lists what medicines you take. General instructions  Take over-the-counter and prescription medicines only as told by your health care provider.  If you are taking prescription pain medicine, take actions to prevent or treat constipation. Your health care provider may recommend that you: ? Drink enough fluid to keep your urine pale yellow. ? Eat foods that are high in fiber, such as fresh fruits and vegetables, whole grains, and beans. ? Limit foods that are high in fat and processed sugars, such as fried or sweet foods. ? Take an over-the-counter or prescription medicines for constipation.  Do not use any products that contain nicotine or tobacco, such as cigarettes and e-cigarettes. These can delay incision or bone healing. If you need help quitting, ask your health care provider.  Wear  compression stockings as told by your health care provider. These stockings help to prevent blood clots and reduce swelling in your legs.  Keep all follow-up visits as told by your health care provider. This is important. Contact a health care provider if you:  Have a fever.  Have severe pain.  Have redness around an incision.  Have more swelling.  Have fluid or blood coming from an incision.  Notice that an incision feels warm to the touch.  Notice pus or a bad smell coming from an incision.  Notice that an incision opens up.  Develop a rash. Get help right away if you:  Have difficulty breathing.  Have shortness of breath.  Have chest pain.  Develop pain in your lower leg or at the back of your knee.  Have numbness or tingling in your lower leg or your foot. Summary  Raise (elevate) the injured area above the level of your heart while you are sitting or lying down.  To help relieve pain and swelling, put ice on your leg for 20 minutes at a time, 2-3 times a day.  If you were prescribed a blood thinner, avoid activities that could cause injury or bruising, and follow instructions about how to prevent falls.  If physical therapy was prescribed, do exercises as directed. Doing exercises may help improve range of motion. This information is not intended to replace advice given to you by your health care provider. Make sure you discuss any questions you have with your health care provider. Document Released: 07/24/2004 Document Revised: 12/17/2016 Document Reviewed: 11/17/2016 Elsevier Patient Education  2020 [employer]  General Anesthesia, Adult, Care After This sheet gives you information about how to care for yourself after your procedure. Your health care provider may also give you more specific instructions. If you have problems or questions, contact your health care provider. What can I expect after the procedure? After the procedure, the following side effects  are common:  Pain or discomfort at the IV site.  Nausea.  Vomiting.  Sore throat.  Trouble concentrating.  Feeling cold or chills.  Weak or tired.  Sleepiness and fatigue.  Soreness and body aches. These side effects can affect parts of the body that were not involved in surgery. Follow these instructions at home:  For at least 24 hours after the procedure:  Have a responsible adult stay with you. It is important to have someone help care for you until you are awake and alert.  Rest as needed.  Do not: ? Participate in activities in which you could fall or become injured. ? Drive. ? Use heavy machinery. ? Drink alcohol. ? Take sleeping pills or medicines that cause drowsiness. ? Make important decisions or sign legal documents. ? Take care of children on your own. Eating and drinking  Follow any instructions from your health care provider about eating or drinking restrictions.  When you feel hungry, start by eating small amounts of foods that are soft and easy to digest (bland), such as toast. Gradually return to your regular diet.  Drink enough fluid to keep your urine pale yellow.  If you vomit, rehydrate by drinking water, juice, or clear broth. General instructions  If you have sleep apnea, surgery and certain medicines can increase your risk for breathing problems. Follow instructions from your health care provider about wearing your sleep device: ? Anytime you are sleeping, including during daytime naps. ? While taking prescription pain medicines, sleeping medicines, or medicines that make you drowsy.  Return to your normal activities as told by your health care provider. Ask your health care provider what activities are safe for you.  Take over-the-counter and prescription medicines only as told by your health care provider.  If you smoke, do not smoke without supervision.  Keep all follow-up visits as told by your health care provider. This is  important. Contact a health care provider if:  You have nausea or vomiting that does not get better with medicine.  You cannot eat or drink without vomiting.  You have pain that does not get better with medicine.  You are unable to pass urine.  You develop a skin rash.  You have a fever.  You have redness around your IV site that gets worse. Get help right away if:  You have difficulty breathing.  You have chest pain.  You have blood in your urine or stool, or you vomit blood. Summary  After the procedure, it is common to have a sore throat or nausea. It is also common to feel tired.  Have a responsible adult stay with you for the first 24 hours after general anesthesia. It is important to have someone help care for you until you are awake and alert.  When you feel hungry, start by eating small amounts of foods that are soft and easy to digest (bland), such as toast. Gradually return to your regular diet.  Drink enough fluid to keep your urine pale yellow.  Return to your normal activities as told by your health care provider. Ask your health care provider what activities are safe for you. This information is not intended to replace advice given to you by your health care provider. Make sure you discuss any questions you have with your health care provider. Document Released: 04/12/2000 Document Revised: 01/07/2017 Document Reviewed: 08/20/2016 Elsevier Patient Education  2020 [employer].

## 2018-10-17 ENCOUNTER — Encounter (HOSPITAL_COMMUNITY)
Admission: RE | Admit: 2018-10-17 | Discharge: 2018-10-17 | Disposition: A | Discharge status: Home / Self Care | Payer: BC Managed Care – PPO | Source: Ambulatory Visit | Attending: Orthopedic Surgery | Admitting: Orthopedic Surgery

## 2018-10-17 ENCOUNTER — Encounter (HOSPITAL_COMMUNITY): Payer: Self-pay

## 2018-10-17 ENCOUNTER — Other Ambulatory Visit: Payer: Self-pay

## 2018-10-17 DIAGNOSIS — Z01812 Encounter for preprocedural laboratory examination: Secondary | ICD-10-CM | POA: Insufficient documentation

## 2018-10-17 DIAGNOSIS — S83281A Other tear of lateral meniscus, current injury, right knee, initial encounter: Secondary | ICD-10-CM | POA: Insufficient documentation

## 2018-10-17 HISTORY — DX: Other specified postprocedural states: Z98.890

## 2018-10-17 HISTORY — DX: Nausea with vomiting, unspecified: R11.2

## 2018-10-17 LAB — BASIC METABOLIC PANEL
Anion gap: 9 (ref 5–15)
BUN: 7 mg/dL (ref 6–20)
CO2: 26 mmol/L (ref 22–32)
Calcium: 9.1 mg/dL (ref 8.9–10.3)
Chloride: 102 mmol/L (ref 98–111)
Creatinine, Ser: 0.56 mg/dL (ref 0.44–1.00)
GFR calc Af Amer: >60 mL/min (ref >60)
GFR calc non Af Amer: >60 mL/min (ref >60)
Glucose, Bld: 70 mg/dL (ref 70–99)
Potassium: 3.8 mmol/L (ref 3.5–5.1)
Sodium: 137 mmol/L (ref 135–145)

## 2018-10-17 LAB — CBC WITH DIFFERENTIAL/PLATELET
Abs Immature Granulocytes: 0.01 10*3/uL (ref 0.00–0.07)
Basophils Absolute: 0 10*3/uL (ref 0.0–0.1)
Basophils Relative: 1 %
Eosinophils Absolute: 0.1 10*3/uL (ref 0.0–0.5)
Eosinophils Relative: 1 %
HCT: 42.8 % (ref 36.0–46.0)
Hemoglobin: 13.9 g/dL (ref 12.0–15.0)
Immature Granulocytes: 0 %
Lymphocytes Relative: 27 %
Lymphs Abs: 1.7 10*3/uL (ref 0.7–4.0)
MCH: 30 pg (ref 26.0–34.0)
MCHC: 32.5 g/dL (ref 30.0–36.0)
MCV: 92.2 fL (ref 80.0–100.0)
Monocytes Absolute: 0.5 10*3/uL (ref 0.1–1.0)
Monocytes Relative: 8 %
Neutro Abs: 4 10*3/uL (ref 1.7–7.7)
Neutrophils Relative %: 63 %
Platelets: 229 10*3/uL (ref 150–400)
RBC: 4.64 MIL/uL (ref 3.87–5.11)
RDW: 12.3 % (ref 11.5–15.5)
WBC: 6.4 10*3/uL (ref 4.0–10.5)
nRBC: 0 % (ref 0.0–0.2)

## 2018-10-18 ENCOUNTER — Other Ambulatory Visit (HOSPITAL_COMMUNITY)
Admission: RE | Admit: 2018-10-18 | Discharge: 2018-10-18 | Disposition: A | Discharge status: Home / Self Care | Payer: BC Managed Care – PPO | Source: Ambulatory Visit | Attending: Orthopedic Surgery | Admitting: Orthopedic Surgery

## 2018-10-18 DIAGNOSIS — Z20828 Contact with and (suspected) exposure to other viral communicable diseases: Secondary | ICD-10-CM | POA: Diagnosis not present

## 2018-10-18 LAB — SARS CORONAVIRUS 2 (TAT 6-24 HRS): SARS Coronavirus 2: NEGATIVE

## 2018-10-18 NOTE — H&P (Signed)
Natasha Anderson   08/11/2018   HISTORY SECTION :       Chief Complaint  Patient presents with  . Knee Pain      right knee pain x 3-4 weeks    The patient presents for evaluation of right knee medial pain x1 month.  She is a 48 year old female runs 2 miles a day when healthy presents with 1 month history of medial knee pain which may have been brought on by new exercise program.  Denies giving way but reports swelling every day unrelieved by ibuprofen.  Pain is not improving with ibuprofen ice rest change in activity and knee sleeve         Review of Systems  Neurological: Negative for tingling.  All other systems reviewed and are negative.       History reviewed. No pertinent past medical history.  No diabetes heart disease or hypertension        Past Surgical History:  Procedure Laterality Date  . ABDOMINAL HYSTERECTOMY      . OOPHORECTOMY         Past Medical History:  Diagnosis Date  . PONV (postoperative nausea and vomiting)     Family History  Problem Relation Age of Onset  . Hypertension Mother   . Arthritis Mother   . Diabetes Father   . Alzheimer's disease Father     Social History   Tobacco Use  . Smoking status: Current Every Day Smoker    Packs/day: 1.00    Years: 20.00    Pack years: 20.00    Types: Cigarettes  . Smokeless tobacco: Never Used  Substance Use Topics  . Alcohol use: No  . Drug use: No      No Known Allergies    Current Outpatient Medications:  .  Estradiol (ESTROGEL) 0.75 MG/1.25 GM (0.06%) topical gel, APPLY TO THE AFFECTED AREA ONCE NIGHTLY, Disp: 50 g, Rfl: 3 .  Multiple Vitamin (MULTIVITAMIN) tablet, Take 1 tablet by mouth daily., Disp: , Rfl:      PHYSICAL EXAM SECTION: 1) BP 114/62   Pulse 68   Temp (!) 97.5 F (36.4 C)   Ht 5\' 6"  (1.676 m)   Wt 145 lb (65.8 kg)   BMI 23.40 kg/m   Body mass index is 23.4 kg/m. General appearance: Well-developed well-nourished no gross deformities  2) Cardiovascular  normal pulse and perfusion in all 4 extremities normal color without edema  3) Neurologically deep tendon reflexes are equal and normal, no sensation loss or deficits no pathologic reflexes   4) Psychological: Awake alert and oriented x3 mood and affect normal   5) Skin no lacerations or ulcerations no nodularity no palpable masses, no erythema or nodularity   6) Musculoskeletal:   Left knee no tenderness normal range of motion all ligaments stable muscle tone normal skin intact with no rashes lesions or ulcerations no erythema   Right knee alignment is normal she has tenderness on the medial joint line she has a small knee effusion she has full range of motion with pain at terminal flexion.  No ligamentous instability normal strength and muscle tone normal patellar mobility without apprehension   Positive McMurray sign for medial pain     MEDICAL DECISION SECTION:      Encounter Diagnoses  Name Primary?  . Acute pain of right  knee    . Acute tear medial meniscus, right, initial encounter Yes     Imaging Joint effusion slight valgus, see report  MRI:  IMPRESSION: 1. Complex tear of the lateral meniscus anterior horn with adjacent 11 mm parameniscal cyst. Horizontal component extends to the anterior horn/body junction where there is a tiny intrameniscal cyst. 2. Sprain of the proximal MCL. 3. Mild medial compartment osteoarthritis. Partial-thickness cartilage loss over the patella. 4. Moderate joint effusion. 5. Focal abnormal edema in the superolateral aspect of Hoffa's fat pad, which can be seen in the setting of patellar maltracking.     Electronically Signed   By: Obie Dredge M.D.   On: 09/03/2018 10:07    Plan:  (Rx., Inj., surg., Frx, MRI/CT, XR:2)   [facility]ARK LATERAL MENISCUS

## 2018-10-19 ENCOUNTER — Ambulatory Visit (HOSPITAL_COMMUNITY): Payer: BC Managed Care – PPO | Admitting: Anesthesiology

## 2018-10-19 ENCOUNTER — Encounter (HOSPITAL_COMMUNITY)
Admission: RE | Disposition: A | Discharge status: Home / Self Care | Payer: Self-pay | Source: Home / Self Care | Attending: Orthopedic Surgery

## 2018-10-19 ENCOUNTER — Encounter (HOSPITAL_COMMUNITY): Payer: Self-pay | Admitting: *Deleted

## 2018-10-19 ENCOUNTER — Ambulatory Visit (HOSPITAL_COMMUNITY)
Admission: RE | Admit: 2018-10-19 | Discharge: 2018-10-19 | Disposition: A | Discharge status: Home / Self Care | Payer: BC Managed Care – PPO | Attending: Orthopedic Surgery | Admitting: Orthopedic Surgery

## 2018-10-19 DIAGNOSIS — S83281A Other tear of lateral meniscus, current injury, right knee, initial encounter: Secondary | ICD-10-CM | POA: Diagnosis not present

## 2018-10-19 DIAGNOSIS — X58XXXA Exposure to other specified factors, initial encounter: Secondary | ICD-10-CM | POA: Diagnosis not present

## 2018-10-19 DIAGNOSIS — M23200 Derangement of unspecified lateral meniscus due to old tear or injury, right knee: Secondary | ICD-10-CM

## 2018-10-19 DIAGNOSIS — F17210 Nicotine dependence, cigarettes, uncomplicated: Secondary | ICD-10-CM | POA: Insufficient documentation

## 2018-10-19 DIAGNOSIS — M2241 Chondromalacia patellae, right knee: Secondary | ICD-10-CM | POA: Insufficient documentation

## 2018-10-19 DIAGNOSIS — M659 Synovitis and tenosynovitis, unspecified: Secondary | ICD-10-CM

## 2018-10-19 DIAGNOSIS — S83209A Unspecified tear of unspecified meniscus, current injury, unspecified knee, initial encounter: Secondary | ICD-10-CM

## 2018-10-19 HISTORY — PX: KNEE ARTHROSCOPY WITH LATERAL MENISECTOMY: SHX6193

## 2018-10-19 HISTORY — PX: CHONDROPLASTY: SHX5177

## 2018-10-19 SURGERY — ARTHROSCOPY, KNEE, WITH LATERAL MENISCECTOMY
Anesthesia: General | Site: Knee | Laterality: Right

## 2018-10-19 MED ORDER — BUPIVACAINE-EPINEPHRINE (PF) 0.5% -1:200000 IJ SOLN
INTRAMUSCULAR | Status: AC
  Filled 2018-10-19: qty 60

## 2018-10-19 MED ORDER — PROMETHAZINE HCL 12.5 MG PO TABS: 12.5000 mg | ORAL_TABLET | Freq: Four times a day (QID) | ORAL | 0 refills | Status: DC | PRN

## 2018-10-19 MED ORDER — ONDANSETRON HCL 4 MG/2ML IJ SOLN
4.0000 mg | Freq: Once | INTRAMUSCULAR | Status: AC
  Administered 2018-10-19: 4 mg via INTRAVENOUS
  Filled 2018-10-19: qty 2

## 2018-10-19 MED ORDER — IBUPROFEN 800 MG PO TABS: 800.0000 mg | ORAL_TABLET | Freq: Three times a day (TID) | ORAL | 1 refills | Status: DC | PRN

## 2018-10-19 MED ORDER — CHLORHEXIDINE GLUCONATE 4 % EX LIQD: 60.0000 mL | Freq: Once | CUTANEOUS | Status: DC

## 2018-10-19 MED ORDER — HYDROCODONE-ACETAMINOPHEN 5-325 MG PO TABS: 1.0000 | ORAL_TABLET | ORAL | 0 refills | Status: AC | PRN

## 2018-10-19 MED ORDER — FENTANYL CITRATE (PF) 250 MCG/5ML IJ SOLN
INTRAMUSCULAR | Status: AC
  Filled 2018-10-19: qty 5

## 2018-10-19 MED ORDER — LACTATED RINGERS IV SOLN
INTRAVENOUS | Status: DC
  Administered 2018-10-19: 07:00:00 1000 mL via INTRAVENOUS

## 2018-10-19 MED ORDER — PROMETHAZINE HCL 25 MG/ML IJ SOLN: 6.2500 mg | INTRAMUSCULAR | Status: DC | PRN

## 2018-10-19 MED ORDER — PROPOFOL 10 MG/ML IV BOLUS
INTRAVENOUS | Status: DC | PRN
  Administered 2018-10-19: 150 mg via INTRAVENOUS

## 2018-10-19 MED ORDER — EPINEPHRINE PF 1 MG/ML IJ SOLN
INTRAMUSCULAR | Status: AC
  Filled 2018-10-19: qty 8

## 2018-10-19 MED ORDER — FENTANYL CITRATE (PF) 100 MCG/2ML IJ SOLN
INTRAMUSCULAR | Status: DC | PRN
  Administered 2018-10-19: 25 ug via INTRAVENOUS
  Administered 2018-10-19: 100 ug via INTRAVENOUS

## 2018-10-19 MED ORDER — LIDOCAINE HCL (CARDIAC) PF 100 MG/5ML IV SOSY
PREFILLED_SYRINGE | INTRAVENOUS | Status: DC | PRN
  Administered 2018-10-19: 30 mg via INTRAVENOUS

## 2018-10-19 MED ORDER — KETOROLAC TROMETHAMINE 30 MG/ML IJ SOLN
30.0000 mg | Freq: Once | INTRAMUSCULAR | Status: AC
  Administered 2018-10-19: 30 mg via INTRAVENOUS
  Filled 2018-10-19: qty 1

## 2018-10-19 MED ORDER — SODIUM CHLORIDE 0.9 % IR SOLN
Status: DC | PRN
  Administered 2018-10-19 (×5): 3000 mL

## 2018-10-19 MED ORDER — HYDROMORPHONE HCL 1 MG/ML IJ SOLN: 0.2500 mg | INTRAMUSCULAR | Status: DC | PRN

## 2018-10-19 MED ORDER — ONDANSETRON HCL 4 MG/2ML IJ SOLN
INTRAMUSCULAR | Status: AC
  Filled 2018-10-19: qty 2

## 2018-10-19 MED ORDER — MIDAZOLAM HCL 2 MG/2ML IJ SOLN: 0.5000 mg | Freq: Once | INTRAMUSCULAR | Status: DC | PRN

## 2018-10-19 MED ORDER — GLYCOPYRROLATE PF 0.2 MG/ML IJ SOSY
PREFILLED_SYRINGE | INTRAMUSCULAR | Status: DC | PRN
  Administered 2018-10-19: .2 mg via INTRAVENOUS

## 2018-10-19 MED ORDER — DEXAMETHASONE SODIUM PHOSPHATE 10 MG/ML IJ SOLN
INTRAMUSCULAR | Status: AC
  Filled 2018-10-19: qty 1

## 2018-10-19 MED ORDER — 0.9 % SODIUM CHLORIDE (POUR BTL) OPTIME
TOPICAL | Status: DC | PRN
  Administered 2018-10-19: 08:00:00 1000 mL

## 2018-10-19 MED ORDER — CEFAZOLIN SODIUM-DEXTROSE 2-4 GM/100ML-% IV SOLN
2.0000 g | INTRAVENOUS | Status: AC
  Administered 2018-10-19: 2 g via INTRAVENOUS

## 2018-10-19 MED ORDER — HYDROCODONE-ACETAMINOPHEN 7.5-325 MG PO TABS: 1.0000 | ORAL_TABLET | Freq: Once | ORAL | Status: DC | PRN

## 2018-10-19 MED ORDER — HYDROCODONE-ACETAMINOPHEN 7.5-325 MG PO TABS
1.0000 | ORAL_TABLET | Freq: Once | ORAL | Status: AC
  Administered 2018-10-19: 09:00:00 1 via ORAL
  Filled 2018-10-19: qty 1

## 2018-10-19 MED ORDER — LIDOCAINE 2% (20 MG/ML) 5 ML SYRINGE
INTRAMUSCULAR | Status: AC
  Filled 2018-10-19: qty 5

## 2018-10-19 MED ORDER — PROPOFOL 10 MG/ML IV BOLUS
INTRAVENOUS | Status: AC
  Filled 2018-10-19: qty 40

## 2018-10-19 MED ORDER — CEFAZOLIN SODIUM-DEXTROSE 2-4 GM/100ML-% IV SOLN
INTRAVENOUS | Status: AC
  Filled 2018-10-19: qty 100

## 2018-10-19 MED ORDER — BUPIVACAINE-EPINEPHRINE (PF) 0.5% -1:200000 IJ SOLN
INTRAMUSCULAR | Status: DC | PRN
  Administered 2018-10-19: 60 mL

## 2018-10-19 MED ORDER — GLYCOPYRROLATE PF 0.2 MG/ML IJ SOSY
PREFILLED_SYRINGE | INTRAMUSCULAR | Status: AC
  Filled 2018-10-19: qty 1

## 2018-10-19 SURGICAL SUPPLY — 43 items
BANDAGE ELASTIC 6 VELCRO NS (GAUZE/BANDAGES/DRESSINGS) ×2 IMPLANT
BLADE 11 SAFETY STRL DISP (BLADE) ×2 IMPLANT
BLADE AGGRESSIVE PLUS 4.0 (BLADE) ×2 IMPLANT
CHLORAPREP W/TINT 26 (MISCELLANEOUS) ×4 IMPLANT
CLOTH BEACON ORANGE TIMEOUT ST (SAFETY) ×2 IMPLANT
COOLER CRYO IC GRAV AND TUBE (ORTHOPEDIC SUPPLIES) ×2 IMPLANT
COVER WAND RF STERILE (DRAPES) ×2 IMPLANT
CUFF CRYO KNEE18X23 MED (MISCELLANEOUS) ×2 IMPLANT
CUFF TOURN SGL QUICK 34 (TOURNIQUET CUFF) ×1
CUFF TRNQT CYL 34X4.125X (TOURNIQUET CUFF) ×1 IMPLANT
DECANTER SPIKE VIAL GLASS SM (MISCELLANEOUS) ×4 IMPLANT
GAUZE 4X4 16PLY RFD (DISPOSABLE) ×2 IMPLANT
GAUZE SPONGE 4X4 12PLY STRL (GAUZE/BANDAGES/DRESSINGS) ×2 IMPLANT
GAUZE XEROFORM 5X9 LF (GAUZE/BANDAGES/DRESSINGS) ×2 IMPLANT
GLOVE BIO SURGEON STRL SZ7 (GLOVE) ×2 IMPLANT
GLOVE BIOGEL PI IND STRL 7.0 (GLOVE) ×3 IMPLANT
GLOVE BIOGEL PI INDICATOR 7.0 (GLOVE) ×3
GLOVE SKINSENSE NS SZ8.0 LF (GLOVE) ×1
GLOVE SKINSENSE STRL SZ8.0 LF (GLOVE) ×1 IMPLANT
GLOVE SS N UNI LF 8.5 STRL (GLOVE) ×2 IMPLANT
GOWN STRL REUS W/TWL LRG LVL3 (GOWN DISPOSABLE) ×2 IMPLANT
GOWN STRL REUS W/TWL XL LVL3 (GOWN DISPOSABLE) ×2 IMPLANT
IV NS IRRIG 3000ML ARTHROMATIC (IV SOLUTION) ×10 IMPLANT
KIT BLADEGUARD II DBL (SET/KITS/TRAYS/PACK) ×2 IMPLANT
KIT TURNOVER CYSTO (KITS) ×2 IMPLANT
MANIFOLD NEPTUNE II (INSTRUMENTS) ×2 IMPLANT
MARKER SKIN DUAL TIP RULER LAB (MISCELLANEOUS) ×2 IMPLANT
NEEDLE HYPO 18GX1.5 BLUNT FILL (NEEDLE) ×4 IMPLANT
NEEDLE HYPO 21X1.5 SAFETY (NEEDLE) ×2 IMPLANT
NEEDLE SPNL 18GX3.5 QUINCKE PK (NEEDLE) ×2 IMPLANT
NS IRRIG 1000ML POUR BTL (IV SOLUTION) ×2 IMPLANT
PACK ARTHRO LIMB DRAPE STRL (MISCELLANEOUS) ×2 IMPLANT
PAD ABD 5X9 TENDERSORB (GAUZE/BANDAGES/DRESSINGS) ×2 IMPLANT
PAD ARMBOARD 7.5X6 YLW CONV (MISCELLANEOUS) ×2 IMPLANT
PADDING CAST COTTON 6X4 STRL (CAST SUPPLIES) ×2 IMPLANT
PROBE BIPOLAR ATHRO 135MM 90D (MISCELLANEOUS) ×2 IMPLANT
SET ARTHROSCOPY INST (INSTRUMENTS) ×2 IMPLANT
SET BASIN LINEN APH (SET/KITS/TRAYS/PACK) ×2 IMPLANT
SUT ETHILON 3 0 FSL (SUTURE) ×2 IMPLANT
SYR 10ML LL (SYRINGE) ×2 IMPLANT
SYR 30ML LL (SYRINGE) ×2 IMPLANT
TUBE CONNECTING 12X1/4 (SUCTIONS) ×4 IMPLANT
TUBING ARTHRO INFLOW-ONLY STRL (TUBING) ×2 IMPLANT

## 2018-10-19 NOTE — Transfer of Care (Signed)
Immediate Anesthesia Transfer of Care Note  Patient: Natasha Anderson  Procedure(s) Performed: KNEE ARTHROSCOPY WITH LATERAL MENISCECTOMY (Right Knee) CHONDROPLASTY OF THE PATELLA (Right Knee)  Patient Location: PACU  Anesthesia Type:General  Level of Consciousness: awake, oriented and patient cooperative  Airway & Oxygen Therapy: Patient Spontanous Breathing  Post-op Assessment: Report given to RN and Post -op Vital signs reviewed and stable  Post vital signs: Reviewed and stable  Last Vitals:  Vitals Value Taken Time  BP    Temp    Pulse 97 10/19/18 0828  Resp 18 10/19/18 0828  SpO2 97 % 10/19/18 0828  Vitals shown include unvalidated device data.  Last Pain:  Vitals:   10/19/18 0703  TempSrc: Oral  PainSc: 1       Patients Stated Pain Goal: 7 (25/05/39 7673)  Complications: No apparent anesthesia complications

## 2018-10-19 NOTE — Anesthesia Procedure Notes (Signed)
Procedure Name: LMA Insertion Date/Time: 10/19/2018 7:32 AM Performed by: Andree Elk, Darolyn Double A, CRNA Pre-anesthesia Checklist: Patient identified, Emergency Drugs available, Suction available, Patient being monitored and Timeout performed Patient Re-evaluated:Patient Re-evaluated prior to induction Oxygen Delivery Method: Circle system utilized Induction Type: IV induction LMA: LMA inserted LMA Size: 4.0 Placement Confirmation: ETT inserted through vocal cords under direct vision and positive ETCO2 Tube secured with: Tape Dental Injury: Teeth and Oropharynx as per pre-operative assessment

## 2018-10-19 NOTE — Anesthesia Postprocedure Evaluation (Signed)
Anesthesia Post Note  Patient: Natasha Anderson  Procedure(s) Performed: RIGHT KNEE ARTHROSCOPY WITH LATERAL MENISCECTOMY (Right Knee) CHONDROPLASTY OF THE PATELLA (Right Knee)  Patient location during evaluation: PACU Anesthesia Type: General Level of consciousness: awake and alert, oriented and patient cooperative Pain management: pain level controlled Vital Signs Assessment: post-procedure vital signs reviewed and stable Respiratory status: spontaneous breathing Cardiovascular status: stable Postop Assessment: no apparent nausea or vomiting Anesthetic complications: no     Last Vitals:  Vitals:   10/19/18 0827 10/19/18 0830  BP: (!) 104/57 103/75  Pulse: 99 88  Resp: 10 16  Temp: 36.6 C   SpO2: 97% 97%    Last Pain:  Vitals:   10/19/18 0827  TempSrc:   PainSc: 0-No pain                 [name]

## 2018-10-19 NOTE — Anesthesia Preprocedure Evaluation (Signed)
Anesthesia Evaluation  Patient identified by MRN, date of birth, ID band Patient awake    Reviewed: Allergy & Precautions, NPO status , Patient's Chart, lab work & pertinent test results  History of Anesthesia Complications (+) PONV and history of anesthetic complications  Airway Mallampati: II  TM Distance: >3 FB Neck ROM: Full    Dental no notable dental hx. (+) Teeth Intact   Pulmonary neg pulmonary ROS, Current SmokerPatient did not abstain from smoking.,  Smokes 1.5PPD -smoked today  Denies inhaler use   Pulmonary exam normal breath sounds clear to auscultation       Cardiovascular Exercise Tolerance: Good negative cardio ROS Normal cardiovascular examI Rhythm:Regular Rate:Normal     Neuro/Psych negative neurological ROS  negative psych ROS   GI/Hepatic negative GI ROS, Neg liver ROS,   Endo/Other  negative endocrine ROS  Renal/GU negative Renal ROS  negative genitourinary   Musculoskeletal negative musculoskeletal ROS (+)   Abdominal   Peds negative pediatric ROS (+)  Hematology negative hematology ROS (+)   Anesthesia Other Findings   Reproductive/Obstetrics negative OB ROS                             Anesthesia Physical Anesthesia Plan  ASA: II  Anesthesia Plan: General   Post-op Pain Management:    Induction: Intravenous  PONV Risk Score and Plan: 3 and Ondansetron, Dexamethasone, Midazolam and Treatment may vary due to age or medical condition  Airway Management Planned: LMA  Additional Equipment:   Intra-op Plan:   Post-operative Plan: Extubation in OR  Informed Consent: I have reviewed the patients History and Physical, chart, labs and discussed the procedure including the risks, benefits and alternatives for the proposed anesthesia with the patient or authorized representative who has indicated his/her understanding and acceptance.     Dental advisory  given  Plan Discussed with: CRNA  Anesthesia Plan Comments: (Plan Full PPE use Plan GA(LMA) vs GETA D/W PT -WTP with same after Q&A)        Anesthesia Quick Evaluation

## 2018-10-19 NOTE — Discharge Instructions (Signed)
General Anesthesia, Adult, Care After °This sheet gives you information about how to care for yourself after your procedure. Your health care provider may also give you more specific instructions. If you have problems or questions, contact your health care provider. °What can I expect after the procedure? °After the procedure, the following side effects are common: °· Pain or discomfort at the IV site. °· Nausea. °· Vomiting. °· Sore throat. °· Trouble concentrating. °· Feeling cold or chills. °· Weak or tired. °· Sleepiness and fatigue. °· Soreness and body aches. These side effects can affect parts of the body that were not involved in surgery. °Follow these instructions at home: ° °For at least 24 hours after the procedure: °· Have a responsible adult stay with you. It is important to have someone help care for you until you are awake and alert. °· Rest as needed. °· Do not: °? Participate in activities in which you could fall or become injured. °? Drive. °? Use heavy machinery. °? Drink alcohol. °? Take sleeping pills or medicines that cause drowsiness. °? Make important decisions or sign legal documents. °? Take care of children on your own. °Eating and drinking °· Follow any instructions from your health care provider about eating or drinking restrictions. °· When you feel hungry, start by eating small amounts of foods that are soft and easy to digest (bland), such as toast. Gradually return to your regular diet. °· Drink enough fluid to keep your urine pale yellow. °· If you vomit, rehydrate by drinking water, juice, or clear broth. °General instructions °· If you have sleep apnea, surgery and certain medicines can increase your risk for breathing problems. Follow instructions from your health care provider about wearing your sleep device: °? Anytime you are sleeping, including during daytime naps. °? While taking prescription pain medicines, sleeping medicines, or medicines that make you drowsy. °· Return to  your normal activities as told by your health care provider. Ask your health care provider what activities are safe for you. °· Take over-the-counter and prescription medicines only as told by your health care provider. °· If you smoke, do not smoke without supervision. °· Keep all follow-up visits as told by your health care provider. This is important. °Contact a health care provider if: °· You have nausea or vomiting that does not get better with medicine. °· You cannot eat or drink without vomiting. °· You have pain that does not get better with medicine. °· You are unable to pass urine. °· You develop a skin rash. °· You have a fever. °· You have redness around your IV site that gets worse. °Get help right away if: °· You have difficulty breathing. °· You have chest pain. °· You have blood in your urine or stool, or you vomit blood. °Summary °· After the procedure, it is common to have a sore throat or nausea. It is also common to feel tired. °· Have a responsible adult stay with you for the first 24 hours after general anesthesia. It is important to have someone help care for you until you are awake and alert. °· When you feel hungry, start by eating small amounts of foods that are soft and easy to digest (bland), such as toast. Gradually return to your regular diet. °· Drink enough fluid to keep your urine pale yellow. °· Return to your normal activities as told by your health care provider. Ask your health care provider what activities are safe for you. °This information is not   intended to replace advice given to you by your health care provider. Make sure you discuss any questions you have with your health care provider. Document Released: 04/12/2000 Document Revised: 01/07/2017 Document Reviewed: 08/20/2016 Elsevier Patient Education  [employer]. Knee Arthroscopy, Care After This sheet gives you information about how to care for yourself after your procedure. Your health care provider may also  give you more specific instructions. If you have problems or questions, contact your health care provider. What can I expect after the procedure? After the procedure, it is common to have:  Soreness.  Swelling.  Pain that can be relieved by taking pain medicine. Follow these instructions at home: Incision care   Follow instructions from your health care provider about how to take care of your incisions. Make sure you: ? Wash your hands with soap and water before you change your bandage (dressing). If soap and water are not available, use hand sanitizer. ? Change your dressing as told by your health care provider. ? Leave stitches (sutures), staples, skin glue, or adhesive strips in place. These skin closures may need to stay in place for 2 weeks or longer. If adhesive strip edges start to loosen and curl up, you may trim the loose edges. Do not remove adhesive strips completely unless your health care provider tells you to do that.  Check your incision areas every day for signs of infection. Check for: ? Redness. ? More swelling or pain. ? Fluid or blood. ? Warmth. ? Pus or a bad smell. Bathing  Do not take baths, swim, or use a hot tub until your health care provider approves. Ask your health care provider if you may take showers. You may only be allowed to take sponge baths. Activity  Do not use your knee to support your body weight until your health care provider says that you can. Follow weight-bearing restrictions as told. Use crutches or other devices to help you move around (assistive devices) as directed.  Ask your health care provider what activities are safe for you during recovery, and what activities you need to avoid.  If physical therapy was prescribed, do exercises as directed. Doing exercises may help improve knee movement and flexibility (range of motion).  Do not lift anything that is heavier than 10 lb (4.5 kg), or the limit that you are told, until your health  care provider says that it is safe. Driving  Do not drive until your health care provider approves. You may be able to drive after 1-3 weeks.  Do not drive or use heavy machinery while taking prescription pain medicine. Managing pain, stiffness, and swelling   If directed, put ice on the injured area: ? Put ice in a plastic bag or use the icing device (cold therapy unit) that you were given. Follow instructions from your health care provider about how to use the icing device. ? Place a towel between your skin and the bag or between your skin and the icing device. ? Leave the ice on for 20 minutes, 2-3 times a day.  Move your toes often to avoid stiffness and to lessen swelling.  Raise (elevate) the injured area above the level of your heart while you are sitting or lying down. If you are taking blood thinners:  Before you take any medicines that contain aspirin or NSAIDs, talk with your health care provider. These medicines increase your risk for dangerous bleeding.  Take your medicine exactly as told, at the same time every day.  Avoid activities that could cause injury or bruising, and follow instructions about how to prevent falls.  Wear a medical alert bracelet or carry a card that lists what medicines you take. General instructions  Take over-the-counter and prescription medicines only as told by your health care provider.  If you are taking prescription pain medicine, take actions to prevent or treat constipation. Your health care provider may recommend that you: ? Drink enough fluid to keep your urine pale yellow. ? Eat foods that are high in fiber, such as fresh fruits and vegetables, whole grains, and beans. ? Limit foods that are high in fat and processed sugars, such as fried or sweet foods. ? Take an over-the-counter or prescription medicines for constipation.  Do not use any products that contain nicotine or tobacco, such as cigarettes and e-cigarettes. These can  delay incision or bone healing. If you need help quitting, ask your health care provider.  Wear compression stockings as told by your health care provider. These stockings help to prevent blood clots and reduce swelling in your legs.  Keep all follow-up visits as told by your health care provider. This is important. Contact a health care provider if you:  Have a fever.  Have severe pain.  Have redness around an incision.  Have more swelling.  Have fluid or blood coming from an incision.  Notice that an incision feels warm to the touch.  Notice pus or a bad smell coming from an incision.  Notice that an incision opens up.  Develop a rash. Get help right away if you:  Have difficulty breathing.  Have shortness of breath.  Have chest pain.  Develop pain in your lower leg or at the back of your knee.  Have numbness or tingling in your lower leg or your foot. Summary  Raise (elevate) the injured area above the level of your heart while you are sitting or lying down.  To help relieve pain and swelling, put ice on your leg for 20 minutes at a time, 2-3 times a day.  If you were prescribed a blood thinner, avoid activities that could cause injury or bruising, and follow instructions about how to prevent falls.  If physical therapy was prescribed, do exercises as directed. Doing exercises may help improve range of motion. This information is not intended to replace advice given to you by your health care provider. Make sure you discuss any questions you have with your health care provider. Document Released: 07/24/2004 Document Revised: 12/17/2016 Document Reviewed: 11/17/2016 Elsevier Patient Education  2020 [employer].

## 2018-10-19 NOTE — Op Note (Signed)
10/19/2018  8:22 AM  PATIENT:  Natasha Anderson  48 y.o. female  PRE-OPERATIVE DIAGNOSIS:  torn lateral meniscus right knee  POST-OPERATIVE DIAGNOSIS:  Torn lateral meniscus and chondromalacia patella   FINDINGS:  Medial compartment-normal  ACL PCL notch normal  Lateral meniscus anterior horn tear posterior horn tear with grade 3 chondral changes medial portion of tibial plateau articular surface femoral condyle normal  Patellofemoral compartment synovitis mild to moderate with chondromalacia grade 2 diffuse central portion to lateral portion   PROCEDURE:  Procedure(s): KNEE ARTHROSCOPY WITH LATERAL MENISCECTOMY, AND CHONDROPLASTY PATELLA RIGHT KNEE  CPT 29881  SURGEON:  Surgeon(s) and Role:    * Olia Hinderliter E, MD - Primary  Surgery was done as follows.  The patient was seen in the preop area the surgical site was confirmed the chart review was completed and the surgical site was marked his right knee  She was taken to the operating room she was given 2 g of Ancef and she had general anesthesia in the supine position  The right leg was prepped and draped sterilely.  Timeout confirmed surgical procedure patient images and correct site  A lateral portal was placed with the knee in 30 degrees of flexion scope was placed into the joint and diagnostic arthroscopy confirmed that the patient had chondromalacia of the patella and lateral meniscal tear anterior and posterior horn with some synovitis in the patellofemoral area near the patellar tendon insertion into the patella.  Made a lateral portal with a spinal needle I used the shaver to debride the anterior horn and posterior horn and then I used a 90 degree ArthroCare wand to contour the meniscus to a stable rim clean up the chondromalacia on the tibial plateau and perform a chondroplasty of the patella and remove the synovitis.  I then irrigated the knee suctioned it free injected it with 60 cc of Marcaine with  epinephrine  I closed the portals with 3-0 nylon suture and then placed a sterile dressing   PHYSICIAN ASSISTANT:   ASSISTANTS: none   ANESTHESIA:   general  EBL:  5 mL   BLOOD ADMINISTERED:none  DRAINS: none   LOCAL MEDICATIONS USED:  MARCAINE     SPECIMEN:  No Specimen  DISPOSITION OF SPECIMEN:  NA  COUNTS:  YES  TOURNIQUET:  * NONE   DICTATION: .Dragon Dictation  PLAN OF CARE: Discharge to home after PACU  PATIENT DISPOSITION:  PACU - hemodynamically stable.   Delay start of Pharmacological VTE agent (>24hrs) due to surgical blood loss or risk of bleeding: not applicable  

## 2018-10-19 NOTE — Interval H&P Note (Signed)
History and Physical Interval Note:  10/19/2018 7:21 AM   BP 107/66   Pulse 77   Temp 98 F (36.7 C) (Oral)   Resp 18   SpO2 100%   CBC Latest Ref Rng & Units 10/17/2018 08/19/2017  WBC 4.0 - 10.5 K/uL 6.4 6.5  Hemoglobin 12.0 - 15.0 g/dL 13.9 15.4  Hematocrit 36.0 - 46.0 % 42.8 47.3(H)  Platelets 150 - 400 K/uL [address] [facility]  has presented today for surgery, with the diagnosis of torn lateral meniscus right knee.  The various methods of treatment have been discussed with the patient and family. After consideration of risks, benefits and other options for treatment, the patient has consented to  Procedure(s): KNEE ARTHROSCOPY WITH LATERAL MENISCECTOMY (Right) as a surgical intervention.  The patient's history has been reviewed, patient examined, no change in status, stable for surgery.  I have reviewed the patient's chart and labs.  Questions were answered to the patient's satisfaction.     Arther Abbott

## 2018-10-19 NOTE — Brief Op Note (Signed)
10/19/2018  8:22 AM  PATIENT:  Natasha Anderson  48 y.o. female  PRE-OPERATIVE DIAGNOSIS:  torn lateral meniscus right knee  POST-OPERATIVE DIAGNOSIS:  Torn lateral meniscus and chondromalacia patella   FINDINGS:  Medial compartment-normal  ACL PCL notch normal  Lateral meniscus anterior horn tear posterior horn tear with grade 3 chondral changes medial portion of tibial plateau articular surface femoral condyle normal  Patellofemoral compartment synovitis mild to moderate with chondromalacia grade 2 diffuse central portion to lateral portion   PROCEDURE:  Procedure(s): KNEE ARTHROSCOPY WITH LATERAL MENISCECTOMY, AND CHONDROPLASTY PATELLA RIGHT KNEE  CPT 29881  SURGEON:  Surgeon(s) and Role:    Carole Civil, MD - Primary  Surgery was done as follows.  The patient was seen in the preop area the surgical site was confirmed the chart review was completed and the surgical site was marked his right knee  She was taken to the operating room she was given 2 g of Ancef and she had general anesthesia in the supine position  The right leg was prepped and draped sterilely.  Timeout confirmed surgical procedure patient images and correct site  A lateral portal was placed with the knee in 30 degrees of flexion scope was placed into the joint and diagnostic arthroscopy confirmed that the patient had chondromalacia of the patella and lateral meniscal tear anterior and posterior horn with some synovitis in the patellofemoral area near the patellar tendon insertion into the patella.  Made a lateral portal with a spinal needle I used the shaver to debride the anterior horn and posterior horn and then I used a 90 degree ArthroCare wand to contour the meniscus to a stable rim clean up the chondromalacia on the tibial plateau and perform a chondroplasty of the patella and remove the synovitis.  I then irrigated the knee suctioned it free injected it with 60 cc of Marcaine with  epinephrine  I closed the portals with 3-0 nylon suture and then placed a sterile dressing   PHYSICIAN ASSISTANT:   ASSISTANTS: none   ANESTHESIA:   general  EBL:  5 mL   BLOOD ADMINISTERED:none  DRAINS: none   LOCAL MEDICATIONS USED:  MARCAINE     SPECIMEN:  No Specimen  DISPOSITION OF SPECIMEN:  NA  COUNTS:  YES  TOURNIQUET:  * NONE   DICTATION: .Dragon Dictation  PLAN OF CARE: Discharge to home after PACU  PATIENT DISPOSITION:  PACU - hemodynamically stable.   Delay start of Pharmacological VTE agent (>24hrs) due to surgical blood loss or risk of bleeding: not applicable

## 2018-10-20 ENCOUNTER — Encounter (HOSPITAL_COMMUNITY): Payer: Self-pay | Admitting: Orthopedic Surgery

## 2018-10-23 ENCOUNTER — Telehealth: Payer: Self-pay | Admitting: Orthopedic Surgery

## 2018-10-23 NOTE — Telephone Encounter (Signed)
I spoke to her about the knee, she had some bleeding has stopped now. Encouraged her to use IBU and ice. She states she d/c IBU due to bleeding  She voiced understanding. To you FYI

## 2018-10-23 NOTE — Telephone Encounter (Signed)
Patient called and left message stating she had knee surgery done by Dr. Aline Brochure on Thursday, 10/20/18.  She said she has some questions and wants someone to call her.  Please call her at [phone]  Thanks

## 2018-10-27 ENCOUNTER — Other Ambulatory Visit: Payer: Self-pay

## 2018-10-27 ENCOUNTER — Ambulatory Visit (INDEPENDENT_AMBULATORY_CARE_PROVIDER_SITE_OTHER): Payer: BC Managed Care – PPO | Admitting: Orthopedic Surgery

## 2018-10-27 VITALS — BP 110/73 | HR 85 | Temp 97.2°F | Ht 66.0 in | Wt 145.0 lb

## 2018-10-27 DIAGNOSIS — M1711 Unilateral primary osteoarthritis, right knee: Secondary | ICD-10-CM | POA: Diagnosis not present

## 2018-10-27 DIAGNOSIS — Z9889 Other specified postprocedural states: Secondary | ICD-10-CM

## 2018-10-27 NOTE — Progress Notes (Signed)
Chief Complaint  Patient presents with  . Follow-up    Recheck on right knee, DOS 10-19-18.  c/o pain posterior thigh ankle foot radiation   Stiffness  No back pain , leg gives way   Sutures removed rom 5-85   10/19/2018   PRE-OPERATIVE DIAGNOSIS:  torn lateral meniscus right knee  POST-OPERATIVE DIAGNOSIS:  Torn lateral meniscus and chondromalacia patella   FINDINGS:  Medial compartment-normal  ACL PCL notch normal  Lateral meniscus anterior horn tear posterior horn tear with grade 3 chondral changes medial portion of tibial plateau articular surface femoral condyle normal  Patellofemoral compartment synovitis mild to moderate with chondromalacia grade 2 diffuse central portion to lateral portion   PROCEDURE:  Procedure(s): KNEE ARTHROSCOPY WITH LATERAL MENISCECTOMY, AND CHONDROPLASTY PATELLA RIGHT KNEE  CPT 29881  SURGEON:  Surgeon(s) and Role:    Carole Civil, MD - Primary   SPECIMEN:  No Specimen  DISPOSITION OF SPECIMEN:  NA  COUNTS:  YES  TOURNIQUET:  * NONE   DICTATION: .Dragon Dictation  PLAN OF CARE: Discharge to home after PACU  PATIENT DISPOSITION:  PACU - hemodynamically stable.   Delay start of Pharmacological VTE agent (>24hrs) due to surgical blood loss or risk of bleeding: not applicable   Brace  Hep Ice Ib 800 Fu 3 weeks

## 2018-10-27 NOTE — Patient Instructions (Signed)
  Brace  Hep Ice Ib 800 Fu 3 weeks

## 2018-11-03 ENCOUNTER — Other Ambulatory Visit: Payer: Self-pay | Admitting: Obstetrics and Gynecology

## 2018-11-16 DIAGNOSIS — Z9889 Other specified postprocedural states: Secondary | ICD-10-CM | POA: Insufficient documentation

## 2018-11-20 ENCOUNTER — Ambulatory Visit (INDEPENDENT_AMBULATORY_CARE_PROVIDER_SITE_OTHER): Payer: BC Managed Care – PPO | Admitting: Orthopedic Surgery

## 2018-11-20 ENCOUNTER — Encounter: Payer: Self-pay | Admitting: Orthopedic Surgery

## 2018-11-20 ENCOUNTER — Other Ambulatory Visit: Payer: Self-pay

## 2018-11-20 VITALS — BP 118/75 | HR 82 | Temp 97.0°F | Ht 65.0 in | Wt 149.0 lb

## 2018-11-20 DIAGNOSIS — Z9889 Other specified postprocedural states: Secondary | ICD-10-CM

## 2018-11-20 MED ORDER — PREDNISONE 10 MG (48) PO TBPK: ORAL_TABLET | Freq: Every day | ORAL | 0 refills | Status: DC

## 2018-11-20 NOTE — Patient Instructions (Signed)
Start new medication   Meds ordered this encounter  Medications  . predniSONE (STERAPRED UNI-PAK 48 TAB) 10 MG (48) TBPK tablet    Sig: Take by mouth daily.    Dispense:  48 tablet    Refill:  0

## 2018-11-20 NOTE — Progress Notes (Signed)
Chief Complaint  Patient presents with  . Follow-up    Recheck on right knee, DOS 10-19-18.    S/P 32 DAYS   No complaints #1 Natasha Anderson is having swelling and stiffness at the end of the day  2.  Natasha Anderson is having pain down the back of her leg at night  3.  Pain medial side of the knee  Today Natasha Anderson has excellent motion in the knee Natasha Anderson has tenderness on the medial joint line where we did see chondral changes of the tibial plateau  Natasha Anderson also is having trouble taking ibuprofen is giving her some back pain and Natasha Anderson thinks it may be affecting her kidneys  Recommend continue ice and elevation at night start prednisone Dosepak follow-up in 2 weeks  The posterior pain may be radicular in nature and may need a work-up of her spine

## 2018-12-04 ENCOUNTER — Ambulatory Visit (INDEPENDENT_AMBULATORY_CARE_PROVIDER_SITE_OTHER): Payer: BC Managed Care – PPO | Admitting: Orthopedic Surgery

## 2018-12-04 ENCOUNTER — Encounter: Payer: Self-pay | Admitting: Orthopedic Surgery

## 2018-12-04 ENCOUNTER — Other Ambulatory Visit: Payer: Self-pay

## 2018-12-04 DIAGNOSIS — M23300 Other meniscus derangements, unspecified lateral meniscus, right knee: Secondary | ICD-10-CM

## 2018-12-04 DIAGNOSIS — Z9889 Other specified postprocedural states: Secondary | ICD-10-CM

## 2018-12-04 NOTE — Progress Notes (Signed)
Chief Complaint  Patient presents with  . Routine Post Op    10/19/18 right knee / still painful  . Leg Pain    has pain down right leg     Postop visit status post arthroscopy right knee chondroplasty was on the patella and lateral meniscectomy was performed  Patient complains of medial knee pain and a feeling of the knee not being near  Continues to have pain behind the leg as well  She did get improvement with the steroid Dosepak but that made her feel weird as well  Examination reveals stable knee no effusion full range of motion good quad strength but pain and tenderness on the medial hamstrings down into the pes bursa  Recommend stretching exercises back continue knee strengthening exercises ice in the evenings  Follow-up in January for recheck  Encounter Diagnoses  Name Primary?  . S/P right knee arthroscopy 10/19/2018 Yes  . Lateral meniscus derangement, right

## 2018-12-04 NOTE — Patient Instructions (Signed)
Exercises for the knee   Exercises for the back

## 2018-12-19 ENCOUNTER — Other Ambulatory Visit: Payer: Self-pay | Admitting: Orthopedic Surgery

## 2019-01-18 ENCOUNTER — Other Ambulatory Visit: Payer: Self-pay

## 2019-01-18 ENCOUNTER — Ambulatory Visit: Payer: BC Managed Care – PPO | Attending: Internal Medicine

## 2019-01-18 DIAGNOSIS — Z20828 Contact with and (suspected) exposure to other viral communicable diseases: Secondary | ICD-10-CM | POA: Diagnosis not present

## 2019-01-18 DIAGNOSIS — Z20822 Contact with and (suspected) exposure to covid-19: Secondary | ICD-10-CM

## 2019-01-19 LAB — NOVEL CORONAVIRUS, NAA: SARS-CoV-2, NAA: NOT DETECTED

## 2019-01-22 ENCOUNTER — Ambulatory Visit: Payer: BC Managed Care – PPO | Admitting: Orthopedic Surgery

## 2019-02-07 ENCOUNTER — Other Ambulatory Visit: Payer: Self-pay

## 2019-02-07 ENCOUNTER — Ambulatory Visit (INDEPENDENT_AMBULATORY_CARE_PROVIDER_SITE_OTHER): Payer: BC Managed Care – PPO | Admitting: Orthopedic Surgery

## 2019-02-07 ENCOUNTER — Encounter: Payer: Self-pay | Admitting: Orthopedic Surgery

## 2019-02-07 VITALS — BP 108/63 | HR 84 | Ht 65.0 in | Wt 148.0 lb

## 2019-02-07 DIAGNOSIS — M1710 Unilateral primary osteoarthritis, unspecified knee: Secondary | ICD-10-CM

## 2019-02-07 DIAGNOSIS — M1711 Unilateral primary osteoarthritis, right knee: Secondary | ICD-10-CM

## 2019-02-07 DIAGNOSIS — Z9889 Other specified postprocedural states: Secondary | ICD-10-CM

## 2019-02-07 MED ORDER — MELOXICAM 7.5 MG PO TABS: 7.5000 mg | ORAL_TABLET | Freq: Every day | ORAL | 5 refills | Status: DC

## 2019-02-07 NOTE — Patient Instructions (Signed)

## 2019-02-07 NOTE — Progress Notes (Signed)
Chief Complaint  Patient presents with  . Knee Pain    right knee painful swollen    Natasha Anderson continues to complain of medial knee pain despite her surgery on the lateral meniscus we did note she had grade 3 chondral changes of the medial tibial plateau  She says she has good days and bad days has trouble going down the stairs.  She can go up the stairs okay.  She is up on her feet all day because of the type of work she does  Past Medical History:  Diagnosis Date  . PONV (postoperative nausea and vomiting)     No Known Allergies  Past Surgical History:  Procedure Laterality Date  . ABDOMINAL HYSTERECTOMY    . CHONDROPLASTY Right 10/19/2018   Procedure: CHONDROPLASTY OF THE PATELLA;  Surgeon: Vickki Hearing, MD;  Location: [facility];  Service: Orthopedics;  Laterality: Right;  . KNEE ARTHROSCOPY WITH LATERAL MENISECTOMY Right 10/19/2018   Procedure: RIGHT KNEE ARTHROSCOPY WITH LATERAL MENISCECTOMY;  Surgeon: Vickki Hearing, MD;  Location: [facility];  Service: Orthopedics;  Laterality: Right;  . OOPHORECTOMY Left       She is awake alert and oriented x3 mood and affect are normal   BP 108/63   Pulse 84   Ht 5\' 5"  (1.651 m)   Wt 148 lb (67.1 kg)   BMI 24.63 kg/m   Her right knee shows no effusion but she does have medial joint line tenderness negative McMurray's normal lateral joint line, her range of motion is full she has no instability muscle tone is normal neurovascular exam is intact skin is clean dry and intact  Straight leg raises are negative bilaterally she has no back pain or hip pathology   Encounter Diagnoses  Name Primary?  . S/P right knee arthroscopy 10/19/2018   . Primary localized osteoarthritis of knee Yes     Procedure note right knee injection   verbal consent was obtained to inject right knee joint  Timeout was completed to confirm the site of injection  The medications used were 40 mg of Depo-Medrol and 1% lidocaine 3 cc  Anesthesia was  provided by ethyl chloride and the skin was prepped with alcohol.  After cleaning the skin with alcohol a 20-gauge needle was used to inject the right knee joint. There were no complications. A sterile bandage was applied.   Meds ordered this encounter  Medications  . meloxicam (MOBIC) 7.5 MG tablet    Sig: Take 1 tablet (7.5 mg total) by mouth daily.    Dispense:  30 tablet    Refill:  5   Chronic problem with exacerbation prescription management and injection

## 2019-03-21 ENCOUNTER — Other Ambulatory Visit: Payer: Self-pay

## 2019-03-21 ENCOUNTER — Ambulatory Visit: Payer: BC Managed Care – PPO | Admitting: Orthopedic Surgery

## 2019-03-21 VITALS — BP 120/78 | HR 83 | Temp 98.2°F | Ht 66.0 in | Wt 145.0 lb

## 2019-03-21 DIAGNOSIS — G8929 Other chronic pain: Secondary | ICD-10-CM | POA: Diagnosis not present

## 2019-03-21 DIAGNOSIS — M1710 Unilateral primary osteoarthritis, unspecified knee: Secondary | ICD-10-CM | POA: Diagnosis not present

## 2019-03-21 DIAGNOSIS — M25561 Pain in right knee: Secondary | ICD-10-CM | POA: Diagnosis not present

## 2019-03-21 DIAGNOSIS — Z9889 Other specified postprocedural states: Secondary | ICD-10-CM

## 2019-03-21 DIAGNOSIS — M1711 Unilateral primary osteoarthritis, right knee: Secondary | ICD-10-CM | POA: Diagnosis not present

## 2019-03-21 NOTE — Progress Notes (Signed)
Chief Complaint  Patient presents with  . Follow-up    Recheck on right knee, DOS 10-19-18.   49 year old female status post arthroscopy right knee in October 2020 continues to have medial joint pain presumably from the medial chondral grade 3 injury lateral meniscal tear and lateral pain seemed to have resolved  She has intermittent swelling in the right knee she does not tolerate the meloxicam and actually gave her some blood in her urine made her feel bad she stopped taking it and her symptoms resolved we have listed as an intolerance  She is tried some topical medications as well  We gave her an injection on January 20 and put her in a hinged knee brace does not seem to be helping  At this point is a difficult situation in a 49 year old was not in the category for knee replacement has already had arthroscopy injection topical and oral NSAIDs and is NSAID tolerant  However we will try unloader brace DonJoy OA reaction brace  Start Tylenol 500 mg 4 times a day  Topical medications of choice ice 1 hour at the end of the day and brace for activity  Encounter Diagnoses  Name Primary?  . S/P right knee arthroscopy 10/19/2018   . Primary localized osteoarthritis of knee   . Chronic pain of right knee Yes    X-ray in 6 months can repeat injection 4 times a year

## 2019-03-21 NOTE — Patient Instructions (Signed)
Tylenol 500 mg 4 x a day   Topical meds of yoiur choice   Ice 1 hr need of the day   Brace for activity

## 2019-03-24 ENCOUNTER — Other Ambulatory Visit: Payer: Self-pay | Admitting: Obstetrics and Gynecology

## 2019-03-24 NOTE — Telephone Encounter (Signed)
estrogel refilled x 1. Needs appt. televisit or in person.

## 2019-04-29 ENCOUNTER — Other Ambulatory Visit: Payer: Self-pay | Admitting: Obstetrics and Gynecology

## 2019-04-29 NOTE — Progress Notes (Signed)
Estrogel refil x 1 yr

## 2019-09-19 ENCOUNTER — Ambulatory Visit: Payer: BC Managed Care – PPO

## 2019-09-19 ENCOUNTER — Ambulatory Visit (INDEPENDENT_AMBULATORY_CARE_PROVIDER_SITE_OTHER): Payer: BC Managed Care – PPO | Admitting: Orthopedic Surgery

## 2019-09-19 ENCOUNTER — Encounter: Payer: Self-pay | Admitting: Orthopedic Surgery

## 2019-09-19 ENCOUNTER — Other Ambulatory Visit: Payer: Self-pay

## 2019-09-19 VITALS — BP 124/56 | HR 92 | Ht 66.0 in | Wt 148.0 lb

## 2019-09-19 DIAGNOSIS — M25561 Pain in right knee: Secondary | ICD-10-CM

## 2019-09-19 DIAGNOSIS — M542 Cervicalgia: Secondary | ICD-10-CM

## 2019-09-19 DIAGNOSIS — M792 Neuralgia and neuritis, unspecified: Secondary | ICD-10-CM

## 2019-09-19 DIAGNOSIS — M25512 Pain in left shoulder: Secondary | ICD-10-CM

## 2019-09-19 DIAGNOSIS — G8929 Other chronic pain: Secondary | ICD-10-CM | POA: Diagnosis not present

## 2019-09-19 NOTE — Patient Instructions (Addendum)
You have received an injection of steroids into the joint. 15% of patients will have increased pain within the 24 hours postinjection.   This is transient and will go away.   We recommend that you use ice packs on the injection site for 20 minutes every 2 hours and extra strength Tylenol 2 tablets every 8 as needed until the pain resolves.  If you continue to have pain after taking the Tylenol and using the ice please call the office for further instructions.  MRI C SPINE   HOME EXERCISES FOR THE SHOULDER    Shoulder Impingement Syndrome  Shoulder impingement syndrome is a condition that causes pain when connective tissues (tendons) surrounding the shoulder joint become pinched. These tendons are part of the group of muscles and tissues that help to stabilize the shoulder (rotator cuff). Beneath the rotator cuff is a fluid-filled sac (bursa) that allows the muscles and tendons to glide smoothly. The bursa may become swollen or irritated (bursitis). Bursitis, swelling in the rotator cuff tendons, or both conditions can decrease how much space is under a bone in the shoulder joint (acromion), resulting in impingement. What are the causes? Shoulder impingement syndrome may be caused by bursitis or swelling of the rotator cuff tendons, which may result from:  Repetitive overhead arm movements.  Falling onto the shoulder.  Weakness in the shoulder muscles. What increases the risk? You may be more likely to develop this condition if you:  Play sports that involve throwing, such as baseball.  Participate in sports such as tennis, volleyball, and swimming.  Work as a Education administrator, Music therapist, or Pharmacologist. Some people are also more likely to develop impingement syndrome because of the shape of their acromion bone. What are the signs or symptoms? The main symptom of this condition is pain on the front or side of the shoulder. The pain may:  Get worse when lifting or raising the arm.  Get  worse at night.  Wake you up from sleeping.  Feel sharp when the shoulder is moved and then fade to an ache. Other symptoms may include:  Tenderness.  Stiffness.  Inability to raise the arm above shoulder level or behind the body.  Weakness. How is this diagnosed? This condition may be diagnosed based on:  Your symptoms and medical history.  A physical exam.  Imaging tests, such as: ? X-rays. ? MRI. ? Ultrasound. How is this treated? This condition may be treated by:  Resting your shoulder and avoiding all activities that cause pain or put stress on the shoulder.  Icing your shoulder.  NSAIDs to help reduce pain and swelling.  One or more injections of medicines to numb the area and reduce inflammation.  Physical therapy.  Surgery. This may be needed if nonsurgical treatments have not helped. Surgery may involve repairing the rotator cuff, reshaping the acromion, or removing the bursa. Follow these instructions at home: Managing pain, stiffness, and swelling   If directed, put ice on the injured area. ? Put ice in a plastic bag. ? Place a towel between your skin and the bag. ? Leave the ice on for 20 minutes, 2-3 times a day. Activity  Rest and return to your normal activities as told by your health care provider. Ask your health care provider what activities are safe for you.  Do exercises as told by your health care provider. General instructions  Do not use any products that contain nicotine or tobacco, such as cigarettes, e-cigarettes, and chewing tobacco. These can  delay healing. If you need help quitting, ask your health care provider.  Ask your health care provider when it is safe for you to drive.  Take over-the-counter and prescription medicines only as told by your health care provider.  Keep all follow-up visits as told by your health care provider. This is important. How is this prevented?  Give your body time to rest between periods of  activity.  Be safe and responsible while being active. This will help you avoid falls.  Maintain physical fitness, including strength and flexibility. Contact a health care provider if:  Your symptoms have not improved after 1-2 months of treatment and rest.  You cannot lift your arm away from your body. Summary  Shoulder impingement syndrome is a condition that causes pain when connective tissues (tendons) surrounding the shoulder joint become pinched.  The main symptom of this condition is pain on the front or side of the shoulder.  This condition is usually treated with rest, ice, and pain medicines as needed. This information is not intended to replace advice given to you by your health care provider. Make sure you discuss any questions you have with your health care provider. Document Revised: 04/28/2018 Document Reviewed: 06/29/2017 Elsevier Patient Education  2020 [employer].

## 2019-09-19 NOTE — Progress Notes (Signed)
Chief Complaint  Patient presents with  . Knee Pain    right   . Shoulder Pain    anterior left shoulder pain  . 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  Her pain is actually medially it comes and goes it seems to be related to soft tissue stretching from the valgus deformity  She tried a brace it did not help as much as we would like  New problem pain lateral side left shoulder no trauma, hard to sleep on her left shoulder mild weakness in the left upper extremity  Neck pain and stiffness status post fall about 2 weeks ago fell onto her head complains of some pain radiating down the arm into the left hand associated with numbness and tingling  Review of systems no chest pain shortness of breath noted no fever chills or malaise  Past Medical History:  Diagnosis Date  . PONV (postoperative nausea and vomiting)     BP (!) 124/56   Pulse 92   Ht 5\' 6"  (1.676 m)   Wt 148 lb (67.1 kg)   BMI 23.89 kg/m   She is awake alert and oriented x3  Mood affect normal  Gait and station normal  Right knee is nontender today on the medial side where it usually is it is slightly swollen compared to the left knee her range of motion is normal she has no lateral compartment disease  The right lower extremities is normal in terms of neurovascular exam and skin exam  The cervical spine is noted to have decreased rotation to the left with normal flexion extension without radicular symptoms  Right shoulder strength normal left shoulder strength weakness in abduction and flexion Hawkins sign was positive Neer sign was negative no shoulder joint line tenderness  Left upper extremity sensory changes in the hand  X-rays of the cervical spine show a C4 on 5 subluxation mid cervical arthrosis  X-rays shoulder  show mild proximal migration of the humeral head otherwise normal shoulder including axillary view  Knee x-ray shows valgus arthritis 13 degrees narrowing of the lateral compartment  Encounter Diagnoses  Name Primary?  . Chronic left shoulder pain Yes  . Chronic pain of right knee   . Neck pain   . Radicular pain of right upper extremity     C-spine will require MRI to evaluate neural elements  Right knee continue ibuprofen  Left shoulder home exercise program subacromial injection  Procedure note the subacromial injection shoulder left   Verbal consent was obtained to inject the  Left   Shoulder  Timeout was completed to confirm the injection site is a subacromial space of the  left  shoulder  Medication used Depo-Medrol 40 mg and lidocaine 1% 3 cc  Anesthesia was provided by ethyl chloride  The injection was performed in the left  posterior subacromial space. After pinning the skin with alcohol and anesthetized the skin with ethyl chloride the subacromial space was injected using a 20-gauge needle. There were no complications  Sterile dressing was applied.  Follow-up after MRI

## 2020-04-17 ENCOUNTER — Ambulatory Visit: Payer: BC Managed Care – PPO

## 2020-04-17 ENCOUNTER — Ambulatory Visit (INDEPENDENT_AMBULATORY_CARE_PROVIDER_SITE_OTHER): Payer: BC Managed Care – PPO | Admitting: Orthopedic Surgery

## 2020-04-17 ENCOUNTER — Other Ambulatory Visit: Payer: Self-pay

## 2020-04-17 ENCOUNTER — Encounter: Payer: Self-pay | Admitting: Orthopedic Surgery

## 2020-04-17 ENCOUNTER — Encounter: Payer: Self-pay | Admitting: Radiology

## 2020-04-17 VITALS — BP 122/70 | HR 93 | Ht 66.0 in | Wt 148.0 lb

## 2020-04-17 DIAGNOSIS — G8929 Other chronic pain: Secondary | ICD-10-CM

## 2020-04-17 DIAGNOSIS — M25561 Pain in right knee: Secondary | ICD-10-CM

## 2020-04-17 NOTE — Patient Instructions (Signed)
Take tylenol 500 mg every 8 hrs

## 2020-04-17 NOTE — Progress Notes (Unsigned)
Natasha Anderson, RT  Natasha Anderson, RT Patient needs Hyaluronic acid injections she has [place] [employer]BS will you work on approval? Whatever they will cover is great, Dr H prefers series of 3 if possible

## 2020-04-17 NOTE — Progress Notes (Signed)
Chief Complaint  Patient presents with  . Knee Pain    Right, has severe pain / feels painful with WB swelling feels like "bone on bone"    Dannon Perlow had an arthroscopy of the knee and a lateral meniscectomy she had lateral compartment arthrosis she had seen [place] for her shoulder back in September but now comes in with increasing pain as well as decreasing motion she says she cannot straighten her knee her pain is interfering with her activities of daily living  pmh  Past Surgical History:  Procedure Laterality Date  . ABDOMINAL HYSTERECTOMY    . CHONDROPLASTY Right 10/19/2018   Procedure: CHONDROPLASTY OF THE PATELLA;  Surgeon: Vickki Hearing, MD;  Location: [facility];  Service: Orthopedics;  Laterality: Right;  . KNEE ARTHROSCOPY WITH LATERAL MENISECTOMY Right 10/19/2018   Procedure: RIGHT KNEE ARTHROSCOPY WITH LATERAL MENISCECTOMY;  Surgeon: Vickki Hearing, MD;  Location: [facility];  Service: Orthopedics;  Laterality: Right;  . OOPHORECTOMY Left     BP 122/70   Pulse 93   Ht 5\' 6"  (1.676 m)   Wt 148 lb (67.1 kg)   BMI 23.89 kg/m   Her knee is not straight she has a flexion contracture she can only bend it about 105 degrees she is tender in the lateral compartment she also has some radicular pain down the right leg  Her x-ray shows valgus arthritis about 12 degrees  Plan is for her to get Synvisc injections and then if that does not work total knee.

## 2020-04-25 NOTE — Progress Notes (Signed)
Submitted online Synvisc injections, pending benefits investigation.

## 2020-05-01 NOTE — Progress Notes (Signed)
For Synvisc BCBS auth #[id number] valid 04/30/20 thru 10/27/20. Ins covers 80% of admin and meds once deductible met.   Patient has 20% copay.  Deductible met = $251.74 out of $1400. Patient would incur all costs.   I discussed with patient the direct purchase program thru [employer] for Trivisc.  She is interested, I will submit her info and they will call her and we will f/u once that is processed.

## 2020-05-05 ENCOUNTER — Telehealth: Payer: Self-pay | Admitting: Orthopedic Surgery

## 2020-05-05 NOTE — Telephone Encounter (Signed)
Call via voice message received from [facility] per Aurea Graff, requesting Quantity; states "0" syringes was marked. Please call ph# [phone], extension [phone], and please leave detailed message if voice mail is reached.

## 2020-05-06 NOTE — Telephone Encounter (Signed)
I called and advised 3 syringes for right knee.  LMVM.

## 2020-05-07 NOTE — Telephone Encounter (Signed)
Voice message received in response from [place] at [facility], [phone] - states in her message - our shipping address is needed so that they may proceed with the order.

## 2020-05-07 NOTE — Telephone Encounter (Signed)
I called and gave shipping address.

## 2020-05-07 NOTE — Telephone Encounter (Signed)
Trivisc will arrive for this patient soon.  Let me know if you see it and I have not?  I need to call her to schedule appts. Thanks.

## 2020-05-15 NOTE — Telephone Encounter (Signed)
She did, she paid for all costs medication.  We can schedule her now for 3 appts, week apart.  I will call and schedule.

## 2020-05-16 ENCOUNTER — Other Ambulatory Visit: Payer: Self-pay | Admitting: *Deleted

## 2020-05-16 MED ORDER — ESTROGEL 0.75 MG/1.25 GM (0.06%) TD GEL: TRANSDERMAL | 11 refills | Status: DC

## 2020-05-22 ENCOUNTER — Encounter: Payer: Self-pay | Admitting: Orthopedic Surgery

## 2020-05-22 ENCOUNTER — Other Ambulatory Visit: Payer: Self-pay

## 2020-05-22 ENCOUNTER — Ambulatory Visit (INDEPENDENT_AMBULATORY_CARE_PROVIDER_SITE_OTHER): Payer: BC Managed Care – PPO | Admitting: Orthopedic Surgery

## 2020-05-22 DIAGNOSIS — M1711 Unilateral primary osteoarthritis, right knee: Secondary | ICD-10-CM

## 2020-05-22 DIAGNOSIS — G8929 Other chronic pain: Secondary | ICD-10-CM

## 2020-05-22 DIAGNOSIS — M1710 Unilateral primary osteoarthritis, unspecified knee: Secondary | ICD-10-CM

## 2020-05-22 NOTE — Progress Notes (Signed)
Chief Complaint  Patient presents with  . Injections    Right Trivisc patient purchased [phone]  exp. 11/18/22    Right knee   1st of 3   Right knee looks good for injection no obvious abnormalities  Lateral approach flexed knee ethyl chloride alcohol site confirmation consent  Medicine injected  Encounter Diagnoses  Name Primary?  . Chronic pain of right knee Yes  . Primary localized osteoarthritis of knee

## 2020-05-22 NOTE — Patient Instructions (Signed)
Ice as needed for swelling  Tylenol or ibuprofen if the knee is hurting.

## 2020-05-29 ENCOUNTER — Ambulatory Visit (INDEPENDENT_AMBULATORY_CARE_PROVIDER_SITE_OTHER): Payer: BC Managed Care – PPO | Admitting: Orthopedic Surgery

## 2020-05-29 ENCOUNTER — Other Ambulatory Visit: Payer: Self-pay

## 2020-05-29 ENCOUNTER — Encounter: Payer: Self-pay | Admitting: Orthopedic Surgery

## 2020-05-29 DIAGNOSIS — M1710 Unilateral primary osteoarthritis, unspecified knee: Secondary | ICD-10-CM

## 2020-05-29 DIAGNOSIS — M1711 Unilateral primary osteoarthritis, right knee: Secondary | ICD-10-CM | POA: Diagnosis not present

## 2020-05-29 DIAGNOSIS — G8929 Other chronic pain: Secondary | ICD-10-CM

## 2020-05-29 NOTE — Progress Notes (Signed)
Chief Complaint  Patient presents with  . Injections    Number 2 right knee    Encounter Diagnoses  Name Primary?  . Chronic pain of right knee Yes  . Primary localized osteoarthritis of knee    Right knee 2nd shot   Trivisc  Procedure note for injection of hyaluronic acid   Diagnosis osteoarthritis of the knee  Verbal consent was obtained to inject the knee with HYALURONIC ACID . Timeout was completed to confirm the injection site as the RIGHT   Knee  Ethyl chloride spray was used for anesthesia Alcohol was used to prep the skin. The infrapatellar lateral portal was used as an injection site and 1 vial of hyaluronic acid  was injected into the knee  Specific Co. Preparation: TRIVISC  No complications were noted  RETURN 1 WEEK

## 2020-06-05 ENCOUNTER — Encounter: Payer: Self-pay | Admitting: Orthopedic Surgery

## 2020-06-05 ENCOUNTER — Ambulatory Visit (INDEPENDENT_AMBULATORY_CARE_PROVIDER_SITE_OTHER): Payer: BC Managed Care – PPO | Admitting: Orthopedic Surgery

## 2020-06-05 ENCOUNTER — Other Ambulatory Visit: Payer: Self-pay

## 2020-06-05 VITALS — Ht 66.0 in | Wt 148.0 lb

## 2020-06-05 DIAGNOSIS — M1711 Unilateral primary osteoarthritis, right knee: Secondary | ICD-10-CM

## 2020-06-05 DIAGNOSIS — M1710 Unilateral primary osteoarthritis, unspecified knee: Secondary | ICD-10-CM

## 2020-06-05 DIAGNOSIS — G8929 Other chronic pain: Secondary | ICD-10-CM

## 2020-06-05 NOTE — Progress Notes (Addendum)
Chief Complaint  Patient presents with  . Injections    Rt knee Trivisc #3 lot number [id number]   Encounter Diagnoses  Name Primary?  . Chronic pain of right knee Yes  . Primary localized osteoarthritis of knee     Procedure note for injection of hyaluronic acid   Diagnosis osteoarthritis of the knee  Verbal consent was obtained to inject the knee with HYALURONIC ACID . Timeout was completed to confirm the injection site as the trivisc   Knee  Ethyl chloride spray was used for anesthesia Alcohol was used to prep the skin. The infrapatellar lateral portal was used as an injection site and 1 vial of hyaluronic acid  was injected into the knee  Specific Co. Preparation: trivisc  No complications were noted

## 2020-06-05 NOTE — Patient Instructions (Signed)
Call in 6 weeks or appt your choice

## 2020-07-09 ENCOUNTER — Ambulatory Visit (INDEPENDENT_AMBULATORY_CARE_PROVIDER_SITE_OTHER): Payer: BC Managed Care – PPO | Admitting: Adult Health

## 2020-07-09 ENCOUNTER — Encounter: Payer: Self-pay | Admitting: Adult Health

## 2020-07-09 ENCOUNTER — Other Ambulatory Visit: Payer: Self-pay

## 2020-07-09 VITALS — BP 106/63 | HR 90 | Ht 66.0 in | Wt 151.0 lb

## 2020-07-09 DIAGNOSIS — Z90710 Acquired absence of both cervix and uterus: Secondary | ICD-10-CM | POA: Diagnosis not present

## 2020-07-09 DIAGNOSIS — Z1211 Encounter for screening for malignant neoplasm of colon: Secondary | ICD-10-CM

## 2020-07-09 DIAGNOSIS — Z79818 Long term (current) use of other agents affecting estrogen receptors and estrogen levels: Secondary | ICD-10-CM

## 2020-07-09 DIAGNOSIS — Z1231 Encounter for screening mammogram for malignant neoplasm of breast: Secondary | ICD-10-CM | POA: Insufficient documentation

## 2020-07-09 DIAGNOSIS — Z01419 Encounter for gynecological examination (general) (routine) without abnormal findings: Secondary | ICD-10-CM | POA: Diagnosis not present

## 2020-07-09 DIAGNOSIS — Z1212 Encounter for screening for malignant neoplasm of rectum: Secondary | ICD-10-CM

## 2020-07-09 DIAGNOSIS — Z79899 Other long term (current) drug therapy: Secondary | ICD-10-CM | POA: Diagnosis not present

## 2020-07-09 LAB — HEMOCCULT GUIAC POC 1CARD (OFFICE): Fecal Occult Blood, POC: NEGATIVE

## 2020-07-09 MED ORDER — ESTROGEL 0.75 MG/1.25 GM (0.06%) TD GEL: TRANSDERMAL | 11 refills | Status: AC

## 2020-07-09 NOTE — Progress Notes (Signed)
Patient ID: Natasha Anderson, female   DOB: 06/26/1970, 50 y.o.   MRN: [id number] History of Present Illness: Natasha Anderson is a 50 year old white female, married, sp hysterectomy, in for a well woman gyn exam. 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   Current Medications, Allergies, Past Medical History, Past Surgical History, Family History and Social History were reviewed in [name] record.     Review of Systems: Patient denies any headaches, hearing loss, fatigue, blurred vision, shortness of breath, chest pain, abdominal pain, problems with bowel movements, urination, or intercourse. No joint pain or mood swings.     Physical Exam:BP 106/63 (BP Location: Right Arm, Patient Position: Sitting, Cuff Size: Normal)   Pulse 90   Ht 5\' 6"  (1.676 m)   Wt 151 lb (68.5 kg)   BMI 24.37 kg/m   General:  Well developed, well nourished, no acute distress Skin:  Warm and dry Neck:  Midline trachea, normal thyroid, good ROM, no lymphadenopathy Lungs; Clear to auscultation bilaterally Breast:  No dominant palpable mass, retraction, or nipple discharge Cardiovascular: Regular rate and rhythm Abdomen:  Soft, non tender, no hepatosplenomegaly Pelvic:  External genitalia is normal in appearance, no lesions.  The vagina is normal in appearance. Urethra has no lesions or masses. The cervix and uterus are absent. No adnexal masses or tenderness noted.Bladder is non tender, no masses felt. Rectal: Good sphincter tone, no polyps, or hemorrhoids felt.  Hemoccult negative. Extremities/musculoskeletal:  No swelling or varicosities noted, no clubbing or cyanosis Psych:  No mood changes, alert and cooperative,seems happy Natasha Anderson is 1 Fall risk is low Depression screen [name] 2/9 07/09/2020 04/02/2016  Decreased Interest 0 0  Down, Depressed, Hopeless 0 0  PHQ - 2 Score 0 0  Altered sleeping 0 -  Tired, decreased energy 1 -  Change in appetite 0 -  Feeling bad or failure about yourself  0 -  Trouble  concentrating 1 -  Moving slowly or fidgety/restless 0 -  Suicidal thoughts 0 -  PHQ-9 Score 2 -    GAD 7 : Generalized Anxiety Score 07/09/2020  Nervous, Anxious, on Edge 0  Control/stop worrying 0  Worry too much - different things 0  Trouble relaxing 0  Restless 0  Easily annoyed or irritable 0  Afraid - awful might happen 0  Total GAD 7 Score 0      Upstream - 07/09/20 0855       Pregnancy Intention Screening   Does the patient want to become pregnant in the next year? No    Does the patient's partner want to become pregnant in the next year? No    Would the patient like to discuss contraceptive options today? No      Contraception Wrap Up   Current Method Female Sterilization   hyst   End Method Female Sterilization   hyst   Contraception Counseling Provided No            Examination chaperoned by 07/11/20 RN   Impression and Plan 1. Encounter for well woman exam with routine gynecological exam Physical in 1 year Try to stop smoking Get PCP  2. Encounter for screening fecal occult blood testing   3. Current use of estrogen therapy Meds ordered this encounter  Medications   Estradiol (ESTROGEL) 0.75 MG/1.25 GM (0.06%) topical gel    Sig: Apply externally to the affected area every night.    Dispense:  50 g    Refill:  11  Order Specific Question:   Supervising Provider    Answer:   Duane Lope H [[id number]]     4. S/P hysterectomy   5. Screening mammogram for breast cancer Pt says she will call for mammogram, order is in,number given for her to call.  6. Screening for colorectal cancer Referred to [facility] for colonoscopy

## 2020-07-14 ENCOUNTER — Encounter: Payer: Self-pay | Admitting: Internal Medicine

## 2020-07-16 ENCOUNTER — Other Ambulatory Visit: Payer: Self-pay

## 2020-07-16 ENCOUNTER — Ambulatory Visit (INDEPENDENT_AMBULATORY_CARE_PROVIDER_SITE_OTHER): Payer: BC Managed Care – PPO | Admitting: Orthopedic Surgery

## 2020-07-16 DIAGNOSIS — M1711 Unilateral primary osteoarthritis, right knee: Secondary | ICD-10-CM

## 2020-07-16 DIAGNOSIS — M1710 Unilateral primary osteoarthritis, unspecified knee: Secondary | ICD-10-CM

## 2020-07-16 DIAGNOSIS — G8929 Other chronic pain: Secondary | ICD-10-CM

## 2020-07-16 NOTE — Progress Notes (Signed)
Exam isFOLLOW UP   Encounter Diagnoses  Name Primary?   Chronic pain of right knee Yes   Primary localized osteoarthritis of knee      Chief Complaint  Patient presents with   Follow-up    Recheck on right knee     S/p TRIVISC X 3 FOR OA RIGHT KNEE   [name] status postarthroscopy of the right knee at which time we followed torn lateral meniscus showed a partial lateral meniscectomy she has lateral knee arthrosis and a 12 degree valgus to the right knee with recent x-ray in March showing joint space narrowing medially some subchondral sclerosis and cyst formation but no osteophytes  Natasha Anderson indicates her knee is doing very well she can climb the stairs with it is moving well  Unremarkable except for the valgus deformity she has no tenderness or swelling she is walking without a limp  Follow-up as needed in 6 months she can have repeat injection of try bisque if needed

## 2020-07-30 ENCOUNTER — Ambulatory Visit (HOSPITAL_COMMUNITY)
Admission: RE | Admit: 2020-07-30 | Discharge: 2020-07-30 | Disposition: A | Discharge status: Home / Self Care | Payer: BC Managed Care – PPO | Source: Ambulatory Visit | Attending: Adult Health | Admitting: Adult Health

## 2020-07-30 ENCOUNTER — Other Ambulatory Visit: Payer: Self-pay

## 2020-07-30 DIAGNOSIS — Z1231 Encounter for screening mammogram for malignant neoplasm of breast: Secondary | ICD-10-CM | POA: Insufficient documentation

## 2020-08-05 ENCOUNTER — Other Ambulatory Visit (HOSPITAL_COMMUNITY): Payer: Self-pay | Admitting: Internal Medicine

## 2020-08-05 DIAGNOSIS — R928 Other abnormal and inconclusive findings on diagnostic imaging of breast: Secondary | ICD-10-CM

## 2020-08-26 ENCOUNTER — Ambulatory Visit (HOSPITAL_COMMUNITY)
Admission: RE | Admit: 2020-08-26 | Discharge: 2020-08-26 | Disposition: A | Discharge status: Home / Self Care | Payer: BC Managed Care – PPO | Source: Ambulatory Visit | Attending: Internal Medicine | Admitting: Internal Medicine

## 2020-08-26 ENCOUNTER — Other Ambulatory Visit: Payer: Self-pay

## 2020-08-26 DIAGNOSIS — R928 Other abnormal and inconclusive findings on diagnostic imaging of breast: Secondary | ICD-10-CM | POA: Diagnosis not present

## 2020-08-26 DIAGNOSIS — R922 Inconclusive mammogram: Secondary | ICD-10-CM | POA: Diagnosis not present

## 2020-09-10 ENCOUNTER — Ambulatory Visit: Payer: BC Managed Care – PPO

## 2020-09-15 ENCOUNTER — Other Ambulatory Visit: Payer: Self-pay | Admitting: Orthopedic Surgery

## 2020-09-15 DIAGNOSIS — M542 Cervicalgia: Secondary | ICD-10-CM

## 2020-09-15 DIAGNOSIS — M792 Neuralgia and neuritis, unspecified: Secondary | ICD-10-CM

## 2022-03-18 ENCOUNTER — Encounter: Payer: Self-pay | Admitting: Radiology

## 2022-05-13 IMAGING — MG MM DIGITAL SCREENING BILAT W/ TOMO AND CAD
8 series · 8 of 24 positions shown · non-contrast
Comparison: Previous exam(s).

CLINICAL DATA: Screening.

EXAM:
DIGITAL SCREENING BILATERAL MAMMOGRAM WITH TOMOSYNTHESIS AND CAD
TECHNIQUE: Bilateral screening digital craniocaudal and mediolateral oblique
mammograms were obtained. Bilateral screening digital breast
tomosynthesis was performed. The images were evaluated with
computer-aided detection.

[L MLO synth-2D]
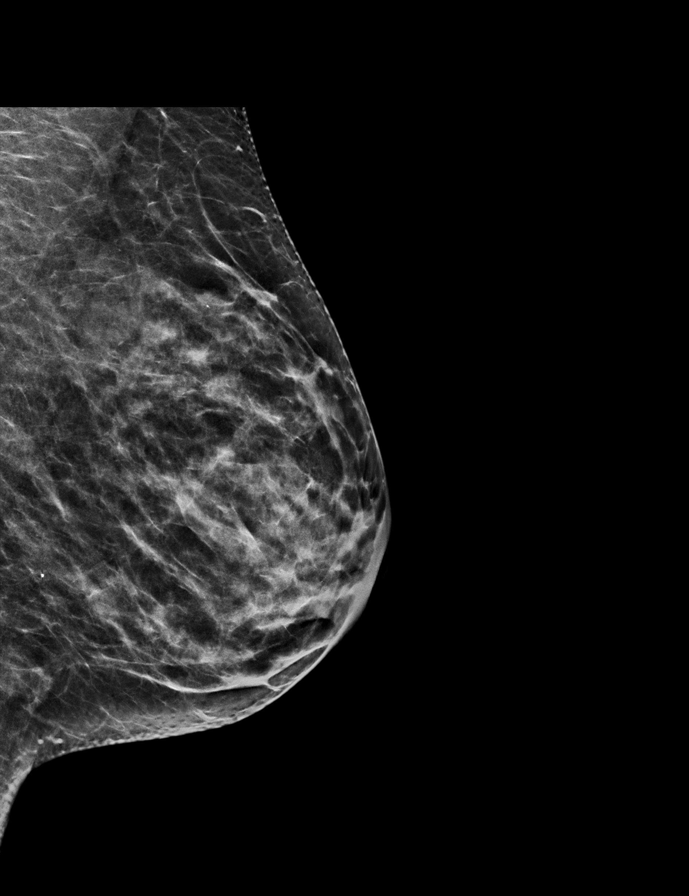

[L CC synth-2D]
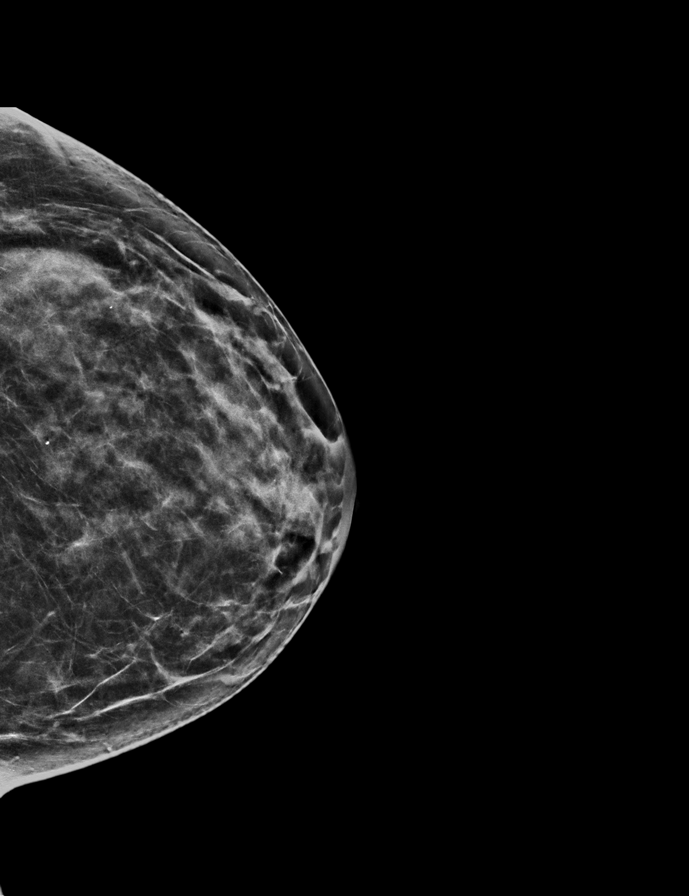

[R CC synth-2D]
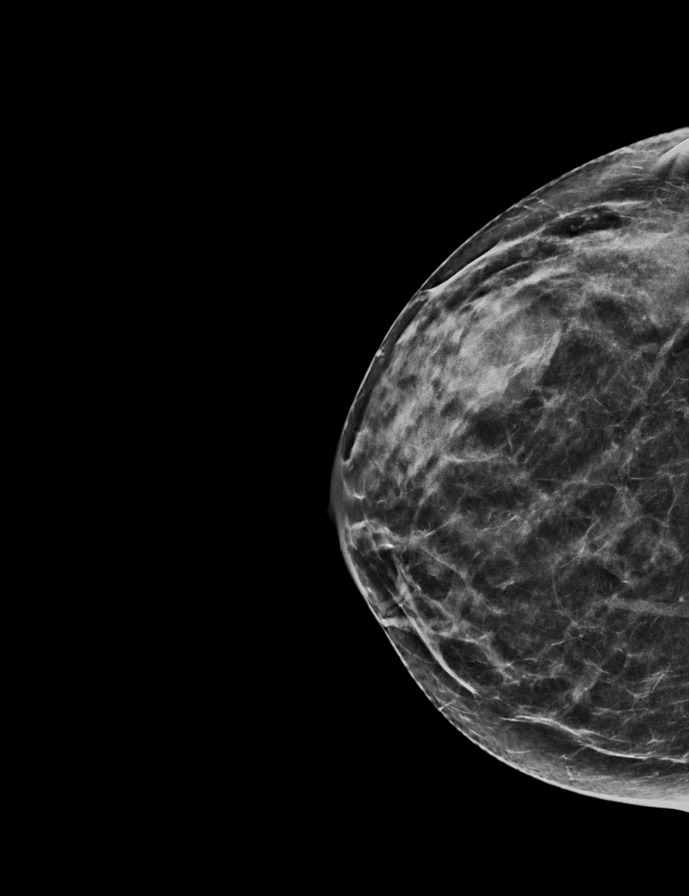

[R MLO synth-2D]
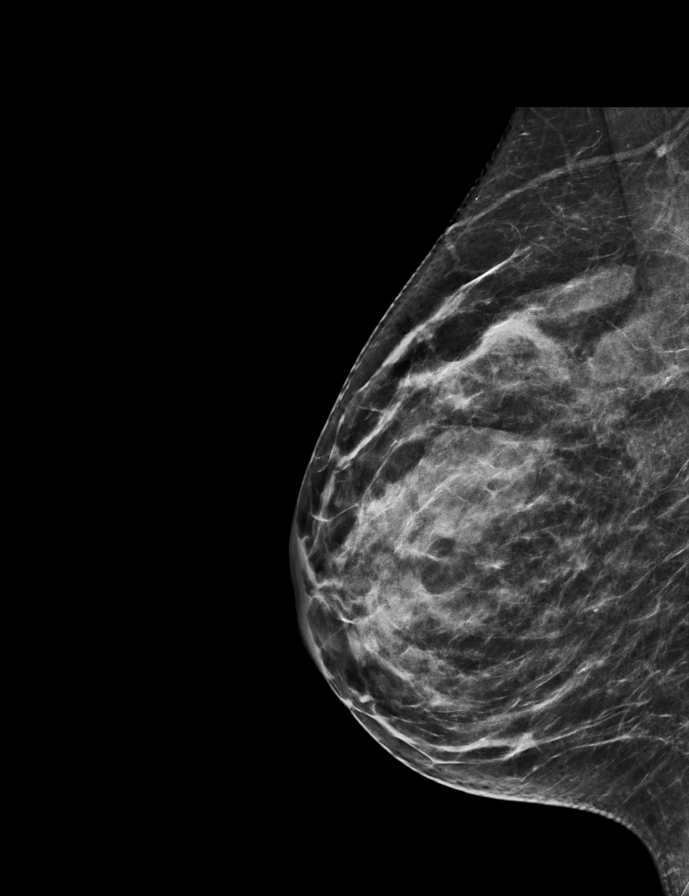

[L CC tomo · tomo slice 32/63.0]
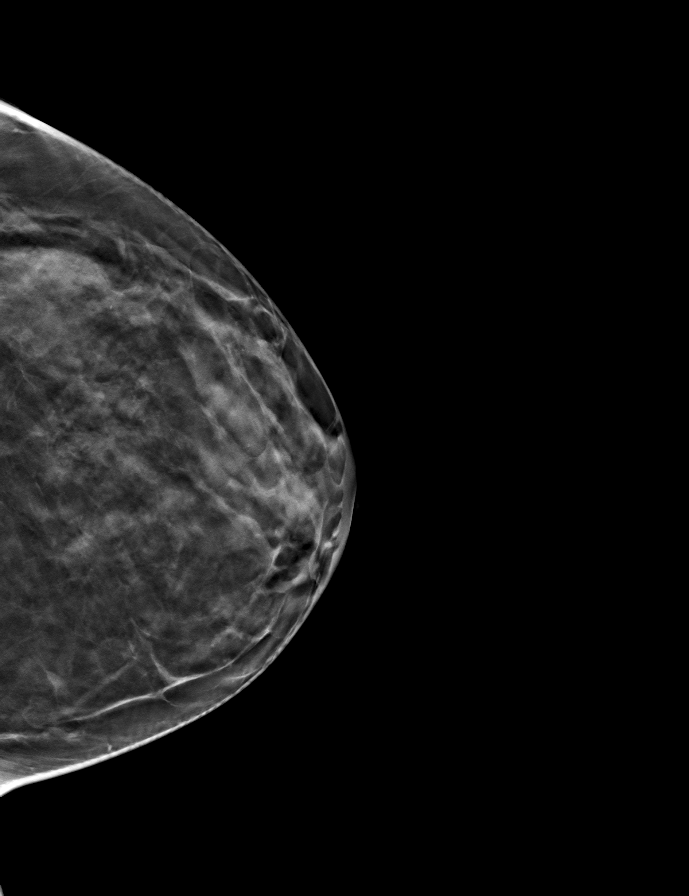

[R MLO tomo · tomo slice 31/60.0]
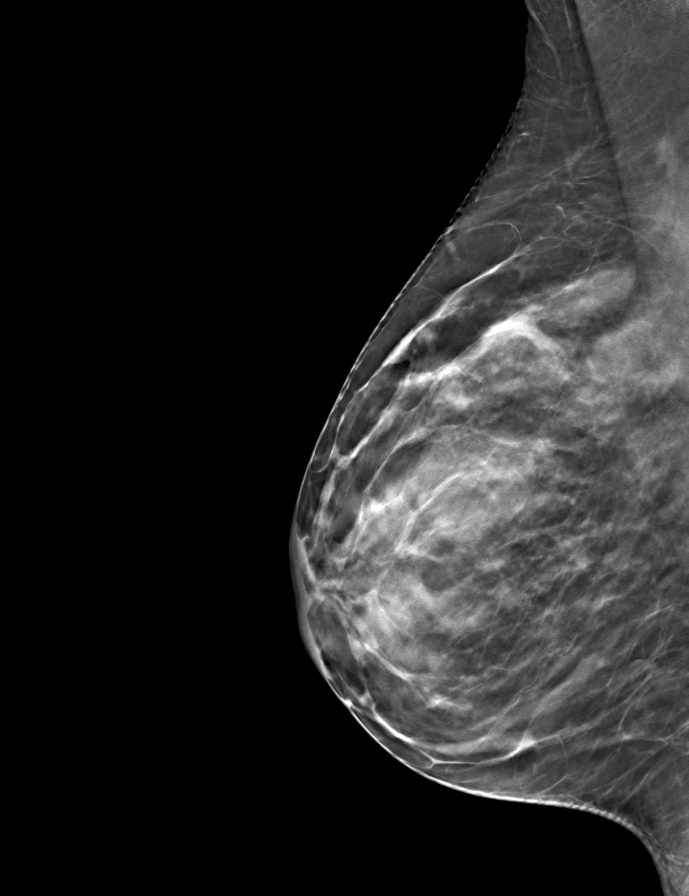

[L MLO tomo · tomo slice 31/60.0]
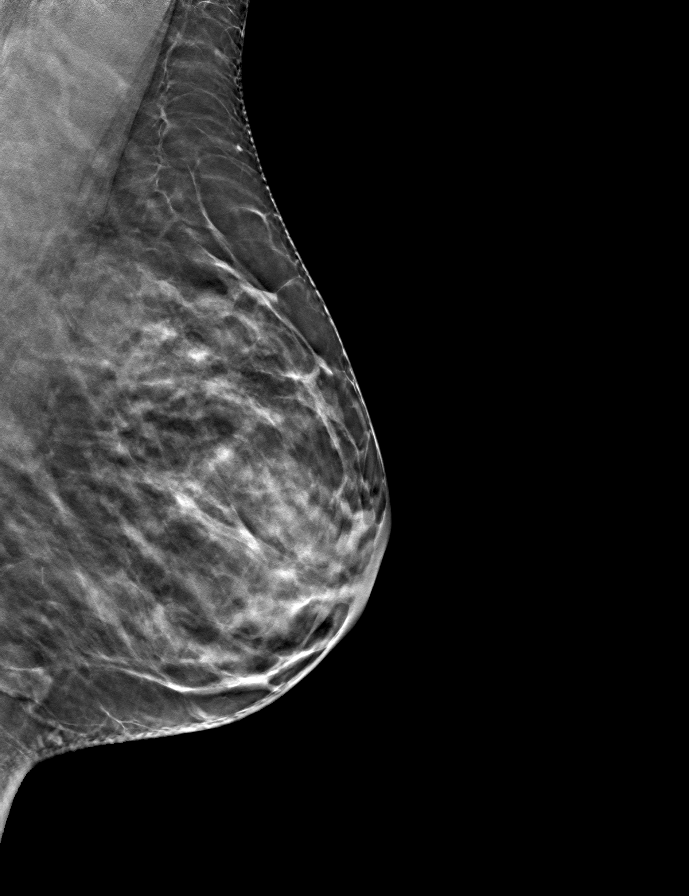

[R CC tomo · tomo slice 31/62.0]
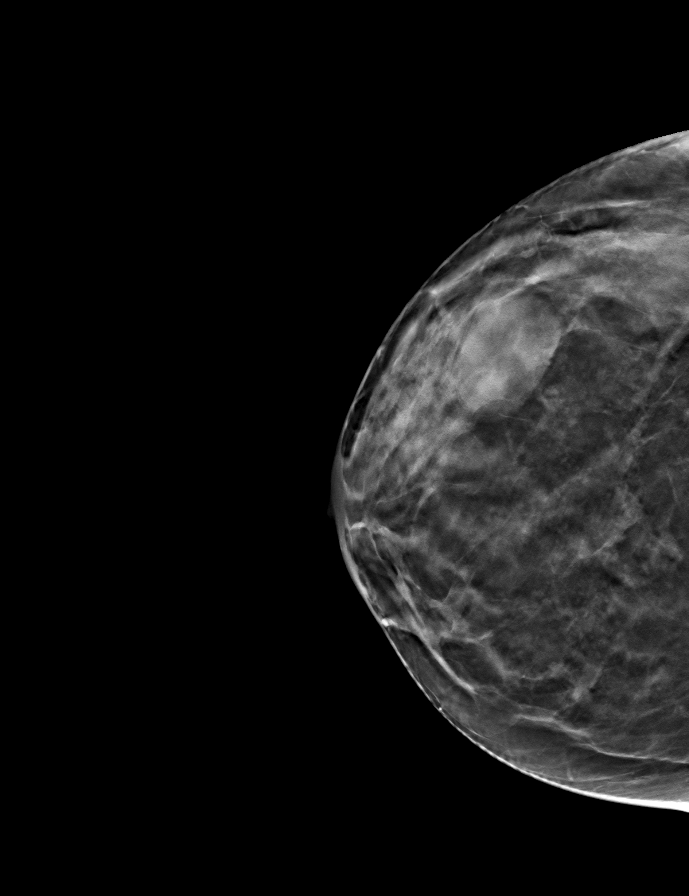

[8 of 24 positions shown; findings below may reference images not displayed]

ACR Breast Density Category c: The breast tissue is heterogeneously
dense, which may obscure small masses.
FINDINGS: In the right breast , possible mass requires further evaluation.
This possible mass is seen within the outer RIGHT breast, cc slice
34 and MLO slice 40.

In the left breast , a possible mass with distortion requires
further evaluation. This possible mass with distortion is seen
within the upper LEFT breast, MLO view only, slice 46.
IMPRESSION: Further evaluation is suggested for possible mass in the right
breast.

Further evaluation is suggested for possible mass with distortion in
the left breast.

RECOMMENDATION:
1. RIGHT breast ultrasound.
2. Diagnostic mammogram and possibly ultrasound of the LEFT breast.
(Code:QV-[id number])

The patient will be contacted regarding the findings, and additional
imaging will be scheduled.

BI-RADS CATEGORY  0: Incomplete. Need additional imaging evaluation
and/or prior mammograms for comparison.
